# Patient Record
Sex: Male | Born: 1960 | Race: White | Hispanic: No | Marital: Married | Smoking: Former smoker
Health system: Southern US, Community
[De-identification: ages and names within clinical notes are randomized; demographics above are authoritative.]

## PROBLEM LIST (undated history)

## (undated) DIAGNOSIS — I10 Essential (primary) hypertension: Secondary | ICD-10-CM

## (undated) DIAGNOSIS — E785 Hyperlipidemia, unspecified: Secondary | ICD-10-CM

## (undated) DIAGNOSIS — F419 Anxiety disorder, unspecified: Secondary | ICD-10-CM

## (undated) DIAGNOSIS — K219 Gastro-esophageal reflux disease without esophagitis: Secondary | ICD-10-CM

---

## 2006-05-18 ENCOUNTER — Ambulatory Visit: Payer: Self-pay | Admitting: Cardiology

## 2012-03-04 ENCOUNTER — Other Ambulatory Visit: Payer: Self-pay

## 2012-03-04 ENCOUNTER — Emergency Department (HOSPITAL_COMMUNITY)
Admission: EM | Admit: 2012-03-04 | Discharge: 2012-03-04 | Disposition: A | Discharge status: Home / Self Care | Payer: Self-pay | Attending: Emergency Medicine | Admitting: Emergency Medicine

## 2012-03-04 ENCOUNTER — Encounter (HOSPITAL_COMMUNITY): Payer: Self-pay | Admitting: *Deleted

## 2012-03-04 DIAGNOSIS — I498 Other specified cardiac arrhythmias: Secondary | ICD-10-CM | POA: Insufficient documentation

## 2012-03-04 DIAGNOSIS — F411 Generalized anxiety disorder: Secondary | ICD-10-CM | POA: Insufficient documentation

## 2012-03-04 DIAGNOSIS — I10 Essential (primary) hypertension: Secondary | ICD-10-CM | POA: Insufficient documentation

## 2012-03-04 DIAGNOSIS — F419 Anxiety disorder, unspecified: Secondary | ICD-10-CM

## 2012-03-04 HISTORY — DX: Essential (primary) hypertension: I10

## 2012-03-04 LAB — BASIC METABOLIC PANEL
BUN: 18 mg/dL (ref 6–23)
CO2: 25 mEq/L (ref 19–32)
Calcium: 10 mg/dL (ref 8.4–10.5)
Chloride: 98 mEq/L (ref 96–112)
Creatinine, Ser: 1.11 mg/dL (ref 0.50–1.35)
GFR calc Af Amer: 87 mL/min — ABNORMAL LOW (ref >90)
GFR calc non Af Amer: 75 mL/min — ABNORMAL LOW (ref >90)
Glucose, Bld: 137 mg/dL — ABNORMAL HIGH (ref 70–99)
Potassium: 3.6 mEq/L (ref 3.5–5.1)
Sodium: 136 mEq/L (ref 135–145)

## 2012-03-04 LAB — CBC WITH DIFFERENTIAL/PLATELET
Basophils Absolute: 0 10*3/uL (ref 0.0–0.1)
Basophils Relative: 0 % (ref 0–1)
Eosinophils Absolute: 0 10*3/uL (ref 0.0–0.7)
Eosinophils Relative: 0 % (ref 0–5)
HCT: 46.4 % (ref 39.0–52.0)
Hemoglobin: 16.1 g/dL (ref 13.0–17.0)
Lymphocytes Relative: 8 % — ABNORMAL LOW (ref 12–46)
Lymphs Abs: 0.7 10*3/uL (ref 0.7–4.0)
MCH: 29.9 pg (ref 26.0–34.0)
MCHC: 34.7 g/dL (ref 30.0–36.0)
MCV: 86.1 fL (ref 78.0–100.0)
Monocytes Absolute: 0.6 10*3/uL (ref 0.1–1.0)
Monocytes Relative: 6 % (ref 3–12)
Neutro Abs: 8.1 10*3/uL — ABNORMAL HIGH (ref 1.7–7.7)
Neutrophils Relative %: 86 % — ABNORMAL HIGH (ref 43–77)
Platelets: 248 10*3/uL (ref 150–400)
RBC: 5.39 MIL/uL (ref 4.22–5.81)
RDW: 12.7 % (ref 11.5–15.5)
WBC: 9.3 10*3/uL (ref 4.0–10.5)

## 2012-03-04 LAB — RAPID URINE DRUG SCREEN, HOSP PERFORMED
Amphetamines: NOT DETECTED
Barbiturates: NOT DETECTED
Benzodiazepines: NOT DETECTED
Cocaine: NOT DETECTED
Opiates: NOT DETECTED
Tetrahydrocannabinol: NOT DETECTED

## 2012-03-04 LAB — ETHANOL: Alcohol, Ethyl (B): <11 mg/dL (ref 0–11)

## 2012-03-04 MED ORDER — LORAZEPAM 1 MG PO TABS
1.0000 mg | ORAL_TABLET | Freq: Once | ORAL | Status: AC
  Administered 2012-03-04: 1 mg via ORAL
  Filled 2012-03-04: qty 1

## 2012-03-04 MED ORDER — LORAZEPAM 1 MG PO TABS: ORAL_TABLET | ORAL | Status: AC

## 2012-03-04 NOTE — ED Provider Notes (Signed)
History     CSN: [id number]  Arrival date & time 03/04/12  1058   First MD Initiated Contact with Patient 03/04/12 1128      Chief Complaint  Patient presents with  . V70.1    HPI Pt was seen at 1150.  Per pt, c/o gradual onset and persistence of constant generalized anxiety over the past month. Describes his anxiety as feeling "nervous," feeling "jittery," having "tremors" and not sleeping well.  Pt states he has been taking his wife's xanax, and tapered himself off of it 3 days ago.  Pt states he was [facility], rx buspar.  States the buspar "is not helping."  States he has a follow up appt at [facility] 03/23/2012.  Denies CP/palpitations, no SOB, no abd pain, no N/V/D, no fevers, no seizures, no rash, no SI, no hallucinations.    Past Medical History  Diagnosis Date  . Hypertension     History reviewed. No pertinent past surgical history.   History  Substance Use Topics  . Smoking status: Never Smoker   . Smokeless tobacco: Not on file  . Alcohol Use: No    Review of Systems ROS: Statement: All systems negative except as marked or noted in the HPI; Constitutional: Negative for fever and chills. ; ; Eyes: Negative for eye pain, redness and discharge. ; ; ENMT: Negative for ear pain, hoarseness, nasal congestion, sinus pressure and sore throat. ; ; Cardiovascular: Negative for chest pain, palpitations, diaphoresis, dyspnea and peripheral edema. ; ; Respiratory: Negative for cough, wheezing and stridor. ; ; Gastrointestinal: Negative for nausea, vomiting, diarrhea, abdominal pain, blood in stool, hematemesis, jaundice and rectal bleeding. . ; ; Genitourinary: Negative for dysuria, flank pain and hematuria. ; ; Musculoskeletal: Negative for back pain and neck pain. Negative for swelling and trauma.; ; Skin: Negative for pruritus, rash, abrasions, blisters, bruising and skin lesion.; ; Neuro: Negative for headache, lightheadedness and neck stiffness. Negative for  weakness, altered level of consciousness , altered mental status, extremity weakness, paresthesias, involuntary movement, seizure and syncope.; Psych:  +anxiety. No SI, no SA, no HI, no hallucinations.      Allergies  Review of patient's allergies indicates no known allergies.  Home Medications   Current Outpatient Rx  Name Route Sig Dispense Refill  . BUSPIRONE HCL 5 MG PO TABS Oral Take 5 mg by mouth 2 (two) times daily.    Marland Kitchen LISINOPRIL-HYDROCHLOROTHIAZIDE 10-12.5 MG PO TABS Oral Take 1 tablet by mouth daily.      BP 171/84  Pulse 147  Temp 98 F (36.7 C) (Oral)  Resp 20  Ht 5\' 10"  (1.778 m)  Wt 178 lb (80.74 kg)  BMI 25.54 kg/m2  SpO2 99%  Physical Exam 1155: Physical examination:  Nursing notes reviewed; Vital signs and O2 SAT reviewed;  Constitutional: Well developed, Well nourished, Well hydrated, In no acute distress; Head:  Normocephalic, atraumatic; Eyes: EOMI, PERRL, No scleral icterus; ENMT: Mouth and pharynx normal, Mucous membranes moist; Neck: Supple, Full range of motion, No lymphadenopathy; Cardiovascular: Tachycardic rate and rhythm, No gallop; Respiratory: Breath sounds clear & equal bilaterally, No rales, rhonchi, wheezes.  Speaking full sentences with ease, Normal respiratory effort/excursion; Chest: Nontender, Movement normal; Abdomen: Soft, Nontender, Nondistended, Normal bowel sounds; Extremities: Pulses normal, No tenderness, No edema, No calf edema or asymmetry.; Neuro: AA&Ox3, Major CN grossly intact.  Speech clear. No facial droop. No gross focal motor or sensory deficits in extremities.; Skin: Color normal, Warm, Dry; Psych:  Anxious.    ED Course  Procedures   MDM  MDM Reviewed: nursing note and vitals Interpretation: labs and ECG    Date: 03/04/2012  Rate: 109  Rhythm: sinus tachycardia  QRS Axis: normal  Intervals: normal  ST/T Wave abnormalities: normal  Conduction Disutrbances:none  Narrative Interpretation:   Old EKG Reviewed: none  available.  Results for orders placed during the hospital encounter of 03/04/12  CBC WITH DIFFERENTIAL      Component Value Range   WBC 9.3  4.0 - 10.5 K/uL   RBC 5.39  4.22 - 5.81 MIL/uL   Hemoglobin 16.1  13.0 - 17.0 g/dL   HCT 16.1  09.6 - 04.5 %   MCV 86.1  78.0 - 100.0 fL   MCH 29.9  26.0 - 34.0 pg   MCHC 34.7  30.0 - 36.0 g/dL   RDW 40.9  81.1 - 91.4 %   Platelets 248  150 - 400 K/uL   Neutrophils Relative 86 (*) 43 - 77 %   Neutro Abs 8.1 (*) 1.7 - 7.7 K/uL   Lymphocytes Relative 8 (*) 12 - 46 %   Lymphs Abs 0.7  0.7 - 4.0 K/uL   Monocytes Relative 6  3 - 12 %   Monocytes Absolute 0.6  0.1 - 1.0 K/uL   Eosinophils Relative 0  0 - 5 %   Eosinophils Absolute 0.0  0.0 - 0.7 K/uL   Basophils Relative 0  0 - 1 %   Basophils Absolute 0.0  0.0 - 0.1 K/uL  BASIC METABOLIC PANEL      Component Value Range   Sodium 136  135 - 145 mEq/L   Potassium 3.6  3.5 - 5.1 mEq/L   Chloride 98  96 - 112 mEq/L   CO2 25  19 - 32 mEq/L   Glucose, Bld 137 (*) 70 - 99 mg/dL   BUN 18  6 - 23 mg/dL   Creatinine, Ser 7.82  0.50 - 1.35 mg/dL   Calcium 95.6  8.4 - 21.3 mg/dL   GFR calc non Af Amer 75 (*) >90 mL/min   GFR calc Af Amer 87 (*) >90 mL/min  ETHANOL      Component Value Range   Alcohol, Ethyl (B) <11  0 - 11 mg/dL  URINE RAPID DRUG SCREEN (HOSP PERFORMED)      Component Value Range   Opiates NONE DETECTED  NONE DETECTED   Cocaine NONE DETECTED  NONE DETECTED   Benzodiazepines NONE DETECTED  NONE DETECTED   Amphetamines NONE DETECTED  NONE DETECTED   Tetrahydrocannabinol NONE DETECTED  NONE DETECTED   Barbiturates NONE DETECTED  NONE DETECTED     1500:  ACT Samson Frederic has eval:  Pt continues to deny SI, only c/o anxiety, states he has an appt at [facility] on 03/23/12 and requesting something for anxiety "to get through until then."  Will rx short course of ativan, as pt states he feels "better" after his dose here.  Pt wants to go home now.  Dx testing d/w pt.  Questions answered.  Verb  understanding, agreeable to d/c home with outpt f/u.               Laray Anger, DO 03/07/12 1037
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
## 2012-03-04 NOTE — ED Notes (Signed)
Pt placed in paper scrubs, belongings locked in EMS cabinet.  Room stripped of cords, security in to wand pt.  nad noted.

## 2012-03-04 NOTE — ED Notes (Signed)
Pt c/o anxiety and muscle tension. Pt states that he has been taking Xanax for the last couple weeks but has not had one for 3 days. Pt states that [facility] and was [facility] on Buspirone. Pt states that it is not helping. Needs help. States that he has appointment on 03/23/12 at [facility] to see the MD but needs help now.

## 2013-07-10 ENCOUNTER — Emergency Department (HOSPITAL_COMMUNITY): Payer: No Typology Code available for payment source

## 2013-07-10 ENCOUNTER — Emergency Department (HOSPITAL_COMMUNITY)
Admission: EM | Admit: 2013-07-10 | Discharge: 2013-07-10 | Disposition: A | Discharge status: Home / Self Care | Payer: No Typology Code available for payment source | Attending: Emergency Medicine | Admitting: Emergency Medicine

## 2013-07-10 ENCOUNTER — Encounter (HOSPITAL_COMMUNITY): Payer: Self-pay | Admitting: Emergency Medicine

## 2013-07-10 DIAGNOSIS — Y92410 Unspecified street and highway as the place of occurrence of the external cause: Secondary | ICD-10-CM | POA: Insufficient documentation

## 2013-07-10 DIAGNOSIS — I10 Essential (primary) hypertension: Secondary | ICD-10-CM | POA: Insufficient documentation

## 2013-07-10 DIAGNOSIS — Z87891 Personal history of nicotine dependence: Secondary | ICD-10-CM | POA: Insufficient documentation

## 2013-07-10 DIAGNOSIS — Y9389 Activity, other specified: Secondary | ICD-10-CM | POA: Insufficient documentation

## 2013-07-10 DIAGNOSIS — Z7982 Long term (current) use of aspirin: Secondary | ICD-10-CM | POA: Insufficient documentation

## 2013-07-10 DIAGNOSIS — Z79899 Other long term (current) drug therapy: Secondary | ICD-10-CM | POA: Insufficient documentation

## 2013-07-10 DIAGNOSIS — S0990XA Unspecified injury of head, initial encounter: Secondary | ICD-10-CM | POA: Insufficient documentation
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
## 2013-07-10 NOTE — ED Notes (Signed)
Pt was ambulated and did great No dizziness and stable

## 2014-05-29 ENCOUNTER — Emergency Department (HOSPITAL_COMMUNITY)
Admission: EM | Admit: 2014-05-29 | Discharge: 2014-05-29 | Disposition: A | Discharge status: Home / Self Care | Payer: No Typology Code available for payment source | Attending: Emergency Medicine | Admitting: Emergency Medicine

## 2014-05-29 ENCOUNTER — Encounter (HOSPITAL_COMMUNITY): Payer: Self-pay | Admitting: Emergency Medicine

## 2014-05-29 DIAGNOSIS — Y929 Unspecified place or not applicable: Secondary | ICD-10-CM | POA: Insufficient documentation

## 2014-05-29 DIAGNOSIS — I10 Essential (primary) hypertension: Secondary | ICD-10-CM | POA: Insufficient documentation

## 2014-05-29 DIAGNOSIS — Y939 Activity, unspecified: Secondary | ICD-10-CM | POA: Insufficient documentation

## 2014-05-29 DIAGNOSIS — Z7982 Long term (current) use of aspirin: Secondary | ICD-10-CM | POA: Insufficient documentation

## 2014-05-29 DIAGNOSIS — T63441A Toxic effect of venom of bees, accidental (unintentional), initial encounter: Secondary | ICD-10-CM | POA: Insufficient documentation

## 2014-05-29 DIAGNOSIS — Z87891 Personal history of nicotine dependence: Secondary | ICD-10-CM | POA: Insufficient documentation

## 2014-05-29 DIAGNOSIS — Z79899 Other long term (current) drug therapy: Secondary | ICD-10-CM | POA: Insufficient documentation

## 2014-05-29 MED ORDER — PREDNISONE 10 MG PO TABS
60.0000 mg | ORAL_TABLET | Freq: Once | ORAL | Status: AC
  Administered 2014-05-29: 60 mg via ORAL
  Filled 2014-05-29 (×2): qty 1

## 2014-05-29 MED ORDER — DIPHENHYDRAMINE HCL 25 MG PO TABS: 25.0000 mg | ORAL_TABLET | ORAL | Status: DC | PRN

## 2014-05-29 MED ORDER — EPINEPHRINE 0.3 MG/0.3ML IJ SOAJ: 0.3000 mg | Freq: Once | INTRAMUSCULAR | Status: DC

## 2014-05-29 MED ORDER — PREDNISONE 50 MG PO TABS: ORAL_TABLET | ORAL | Status: DC

## 2014-05-29 NOTE — Progress Notes (Signed)
CM called to ED for medication assistance. Pt does not have insurance and [facility]. Pt needing epi pen urgently. [employer] voucher given to pt. Explained MATCH voucher to pt who verbalized understanding. No other CM needs noted.
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
## 2014-05-29 NOTE — ED Provider Notes (Signed)
CSN: [id number]     Arrival date & time 05/29/14  1307 History   First MD Initiated Contact with Patient 05/29/14 1409     Chief complaint - bee sting    Patient is a 53 y.o. male presenting with allergic reaction. The history is provided by the patient.  Allergic Reaction Presenting symptoms: swelling   Presenting symptoms: no difficulty breathing, no difficulty swallowing, no itching, no rash and no wheezing   Severity:  Mild Relieved by:  Cold compresses Worsened by:  Nothing tried  Patient reports he was stung on right side of face by bee over 2 hours ago.  (nurse documented left side of face, but he was stung on right side) He reports localized swelling without any other symptoms - no difficulty breathing/swallowing.  No rash/itching is reported He has been stung previously but has never had swelling like this.  Past Medical History  Diagnosis Date  . Hypertension    History reviewed. No pertinent past surgical history. History reviewed. No pertinent family history. History  Substance Use Topics  . Smoking status: Former Games developermoker  . Smokeless tobacco: Not on file  . Alcohol Use: No    Review of Systems  Constitutional: Negative for fever.  HENT: Negative for trouble swallowing.   Respiratory: Negative for shortness of breath and wheezing.   Cardiovascular: Negative for chest pain.  Gastrointestinal: Negative for diarrhea.  Skin: Negative for itching and rash.  Neurological: Negative for syncope.  All other systems reviewed and are negative.     Allergies  Review of patient's allergies indicates no known allergies.  Home Medications   Prior to Admission medications   Medication Sig Start Date End Date Taking? Authorizing Provider  aspirin EC 81 MG tablet Take 81 mg by mouth every morning.   Yes Historical Provider, MD  carvedilol (COREG) 6.25 MG tablet Take 6.25 mg by mouth 2 (two) times daily.   Yes Historical Provider, MD  lisinopril-hydrochlorothiazide  (PRINZIDE,ZESTORETIC) 10-12.5 MG per tablet Take 1 tablet by mouth daily.   Yes Historical Provider, MD  Omega-3 Fatty Acids (OMEGA-3 PO) Take 1 capsule by mouth 2 (two) times daily.   Yes Historical Provider, MD  diphenhydrAMINE (BENADRYL) 25 MG tablet Take 1 tablet (25 mg total) by mouth every 4 (four) hours as needed for itching. 05/29/14   Joya Gaskinsonald W Felice Hope, MD  EPINEPHrine (EPIPEN 2-PAK) 0.3 mg/0.3 mL IJ SOAJ injection Inject 0.3 mLs (0.3 mg total) into the muscle once. 05/29/14   Joya Gaskinsonald W Hoyt Leanos, MD  predniSONE (DELTASONE) 50 MG tablet One tablet PO daily for 4 days 05/29/14   Joya Gaskinsonald W Ranferi Clingan, MD   BP 166/101  Pulse 82  Temp(Src) 98.7 F (37.1 C) (Oral)  Resp 18  Ht 5\' 10"  (1.778 m)  Wt 170 lb (77.111 kg)  BMI 24.39 kg/m2  SpO2 100% Physical Exam CONSTITUTIONAL: Well developed/well nourished HEAD: Normocephalic/atraumatic EYES: EOMI/PERRL ENMT: Mucous membranes moist. Minimal edema to right side of face.  His lips and tongue are not edematous.  Uvula midline.  No stridor.   NECK: supple no meningeal signs CV: S1/S2 noted, no murmurs/rubs/gallops noted LUNGS: Lungs are clear to auscultation bilaterally, no apparent distress ABDOMEN: soft, nontender, no rebound or guarding NEURO: Pt is awake/alert, moves all extremitiesx4 EXTREMITIES: pulses normal, full ROM SKIN: warm, color normal, no rash is noted PSYCH: no abnormalities of mood noted  ED Course  Procedures   Pt with localized swelling to right side of face.  I don't feel he has true  anaphylactic reaction and merely has localized edema from sting.  He has no other symptoms He was give Rx for epipen in case he has any new symptoms at home (difficulty breathing/swallowing, any tongue or lip swelling)  MDM   Final diagnoses:  Allergic reaction to bee sting, accidental or unintentional, initial encounter    Nursing notes including past medical history and social history reviewed and considered in  documentation     Joya Gaskinsonald W Burnett Spray, MD 05/29/14 1549

## 2014-05-29 NOTE — ED Notes (Signed)
Pt and family enquiring as too cost of epi pen - pt doesn't have any insurance - gets assistance from the health dept concerning his prescription meds .-- call placed to case management , discussed situation with Barbette OrGenevieve, Stated that   she would check into it and come to see pt or let his nurse know if any assistance can be given

## 2014-05-29 NOTE — Discharge Instructions (Signed)

## 2014-05-29 NOTE — ED Notes (Signed)
Pt and family member educated on use of epi pen , instructed pt to read instructions and to ensure that he keeps the epi pen needle in his leg for the required 10 seconds minimum .

## 2016-09-28 ENCOUNTER — Emergency Department (HOSPITAL_COMMUNITY)
Admission: EM | Admit: 2016-09-28 | Discharge: 2016-09-28 | Disposition: A | Discharge status: Home / Self Care | Payer: Self-pay | Attending: Emergency Medicine | Admitting: Emergency Medicine

## 2016-09-28 ENCOUNTER — Emergency Department (HOSPITAL_COMMUNITY): Payer: Self-pay

## 2016-09-28 ENCOUNTER — Encounter (HOSPITAL_COMMUNITY): Payer: Self-pay | Admitting: Emergency Medicine

## 2016-09-28 DIAGNOSIS — Z7982 Long term (current) use of aspirin: Secondary | ICD-10-CM | POA: Insufficient documentation

## 2016-09-28 DIAGNOSIS — J069 Acute upper respiratory infection, unspecified: Secondary | ICD-10-CM | POA: Insufficient documentation

## 2016-09-28 DIAGNOSIS — Z79899 Other long term (current) drug therapy: Secondary | ICD-10-CM | POA: Insufficient documentation

## 2016-09-28 DIAGNOSIS — I10 Essential (primary) hypertension: Secondary | ICD-10-CM | POA: Insufficient documentation

## 2016-09-28 MED ORDER — BENZONATATE 100 MG PO CAPS: 200.0000 mg | ORAL_CAPSULE | Freq: Two times a day (BID) | ORAL | 0 refills | Status: DC | PRN

## 2016-09-28 MED ORDER — FLUTICASONE PROPIONATE 50 MCG/ACT NA SUSP: 1.0000 | Freq: Every day | NASAL | 2 refills | Status: AC

## 2016-09-28 MED ORDER — CETIRIZINE HCL 10 MG PO TABS: 10.0000 mg | ORAL_TABLET | Freq: Every day | ORAL | 0 refills | Status: DC

## 2016-09-28 NOTE — ED Triage Notes (Signed)
Patient c/o cough with sore throat that started yesterday. Denies any fevers. Per patient sinus pressure and congestion. Patient states cough occasional productive. Per patient friend recently diagnosed with Strep.

## 2016-09-28 NOTE — ED Provider Notes (Signed)
[facility]EM[facility]PT Provider Note   CSN: [id number] Arrival date & time: 09/28/16  1359  By signing my name below, I, Cynda Acres, attest that this documentation has been prepared under the direction and in the presence of Roxy Horseman, PA-C. Electronically Signed: Cynda Acres, Scribe. 09/28/16. 3:10 PM.  History   Chief Complaint Chief Complaint  Patient presents with  . Cough    HPI Comments: Timothy Reed is a 56 y.o. male with a hx of HTN, who presents to the Emergency Department complaining of a sudden-onset, constant sore throat that began yesterday. Patient has associated sinus pressure, congestion, and an intermittent productive cough. Patient states his friend was recently diagnosed with strep. Patient reports using sore throat spray and gargling peroxide with no relief. Patient denies any fever, chills, body aches, nausea, vomiting, diarrhea, or tobacco use.   The history is provided by the patient. No language interpreter was used.    Past Medical History:  Diagnosis Date  . Hypertension     There are no active problems to display for this patient.   History reviewed. No pertinent surgical history.     Home Medications    Prior to Admission medications   Medication Sig Start Date End Date Taking? Authorizing Provider  aspirin EC 81 MG tablet Take 81 mg by mouth every morning.    Historical Provider, MD  carvedilol (COREG) 6.25 MG tablet Take 6.25 mg by mouth 2 (two) times daily.    Historical Provider, MD  diphenhydrAMINE (BENADRYL) 25 MG tablet Take 1 tablet (25 mg total) by mouth every 4 (four) hours as needed for itching. 05/29/14   Zadie Rhine, MD  EPINEPHrine (EPIPEN 2-PAK) 0.3 mg/0.3 mL IJ SOAJ injection Inject 0.3 mLs (0.3 mg total) into the muscle once. 05/29/14   Zadie Rhine, MD  lisinopril-hydrochlorothiazide (PRINZIDE,ZESTORETIC) 10-12.5 MG per tablet Take 1 tablet by mouth daily.    Historical Provider, MD  Omega-3 Fatty Acids  (OMEGA-3 PO) Take 1 capsule by mouth 2 (two) times daily.    Historical Provider, MD  predniSONE (DELTASONE) 50 MG tablet One tablet PO daily for 4 days 05/29/14   Zadie Rhine, MD    Family History History reviewed. No pertinent family history.  Social History Social History  Substance Use Topics  . Smoking status: Never Smoker  . Smokeless tobacco: Never Used  . Alcohol use No     Allergies   Patient has no known allergies.   Review of Systems Review of Systems  Constitutional: Negative for fever.  HENT: Positive for congestion and sinus pressure.   Gastrointestinal: Negative for diarrhea, nausea and vomiting.     Physical Exam Updated Vital Signs BP 144/93 (BP Location: Left Arm)   Pulse 96   Temp 98.9 F (37.2 C) (Oral)   Resp 16   Ht 5\' 10"  (1.778 m)   Wt 175 lb (79.4 kg)   SpO2 100%   BMI 25.11 kg/m   Physical Exam  Physical Exam  Constitutional: Pt  is oriented to person, place, and time. Appears well-developed and well-nourished. No distress.  HENT:  Head: Normocephalic and atraumatic.  Right Ear: Tympanic membrane, external ear and ear canal normal.  Left Ear: Tympanic membrane, external ear and ear canal normal.  Nose: Mucosal edema and moderate rhinorrhea present. No epistaxis. Right sinus exhibits no maxillary sinus tenderness and no frontal sinus tenderness. Left sinus exhibits no maxillary sinus tenderness and no frontal sinus tenderness.  Mouth/Throat: Uvula is midline and mucous membranes are normal.  Mucous membranes are not pale and not cyanotic. No oropharyngeal exudate, posterior oropharyngeal edema, posterior oropharyngeal erythema or tonsillar abscesses.  Eyes: Conjunctivae are normal. Pupils are equal, round, and reactive to light.  Neck: Normal range of motion and full passive range of motion without pain.  Cardiovascular: Normal rate and intact distal pulses.   Pulmonary/Chest: Effort normal and breath sounds normal. No stridor.  Clear  and equal breath sounds without focal wheezes, rhonchi, rales  Abdominal: Soft. Bowel sounds are normal. There is no tenderness.  Musculoskeletal: Normal range of motion.  Lymphadenopathy:    Pthas no cervical adenopathy.  Neurological: Pt is alert and oriented to person, place, and time.  Skin: Skin is warm and dry. No rash noted. Pt is not diaphoretic.  Psychiatric: Normal mood and affect.  Nursing note and vitals reviewed.  ED Treatments / Results  DIAGNOSTIC STUDIES: Oxygen Saturation is 100% on RA, normal by my interpretation.    COORDINATION OF CARE: 3:10 PM Discussed treatment plan with pt at bedside and pt agreed to plan, which includes cough medication, tylenol, ibuprofen, and nasal spray.   Labs (all labs ordered are listed, but only abnormal results are displayed) Labs Reviewed - No data to display  EKG  EKG Interpretation None       Radiology Dg Chest 2 View  Result Date: 09/28/2016 CLINICAL DATA:  Head congestion, cough, and sore throat for 2-3 days. Nonsmoker. History of hypertension. EXAM: CHEST  2 VIEW COMPARISON:  None in FINDINGS: The lungs are adequately inflated and clear. The heart and pulmonary vascularity are normal. The mediastinum is normal in width. The trachea is midline. The bony thorax exhibits no acute abnormality. IMPRESSION: There is no active cardiopulmonary disease. Electronically Signed   By: David  SwazilandJordan M.D.   On: 09/28/2016 14:43    Procedures Procedures (including critical care time)  Medications Ordered in ED Medications - No data to display   Initial Impression / Assessment and Plan / ED Course  I have reviewed the triage vital signs and the nursing notes.  Pertinent labs & imaging results that were available during my care of the patient were reviewed by me and considered in my medical decision making (see chart for details).     Pt CXR negative for acute infiltrate. Patients symptoms are consistent with URI, likely viral  etiology. Discussed that antibiotics are not indicated for viral infections. Pt will be discharged with symptomatic treatment.  Verbalizes understanding and is agreeable with plan. Pt is hemodynamically stable & in NAD prior to dc.   Final Clinical Impressions(s) / ED Diagnoses   Final diagnoses:  Upper respiratory tract infection, unspecified type    New Prescriptions New Prescriptions   BENZONATATE (TESSALON) 100 MG CAPSULE    Take 2 capsules (200 mg total) by mouth 2 (two) times daily as needed for cough.   CETIRIZINE (ZYRTEC ALLERGY) 10 MG TABLET    Take 1 tablet (10 mg total) by mouth daily.   FLUTICASONE (FLONASE) 50 MCG/ACT NASAL SPRAY    Place 1 spray into both nostrils daily.   I personally performed the services described in this documentation, which was scribed in my presence. The recorded information has been reviewed and is accurate.       Roxy HorsemanRobert Arminta Gamm, PA-C 09/28/16 1515    Raeford RazorStephen Kohut, MD 09/28/16 [phone]

## 2016-09-28 NOTE — ED Triage Notes (Signed)
Pt here for cough and sore throat. °

## 2016-12-19 ENCOUNTER — Encounter (HOSPITAL_COMMUNITY): Payer: Self-pay

## 2016-12-19 ENCOUNTER — Emergency Department (HOSPITAL_COMMUNITY)
Admission: EM | Admit: 2016-12-19 | Discharge: 2016-12-19 | Disposition: A | Discharge status: Home / Self Care | Payer: Self-pay | Attending: Emergency Medicine | Admitting: Emergency Medicine

## 2016-12-19 DIAGNOSIS — R221 Localized swelling, mass and lump, neck: Secondary | ICD-10-CM

## 2016-12-19 DIAGNOSIS — K029 Dental caries, unspecified: Secondary | ICD-10-CM | POA: Insufficient documentation

## 2016-12-19 DIAGNOSIS — I10 Essential (primary) hypertension: Secondary | ICD-10-CM | POA: Insufficient documentation

## 2016-12-19 DIAGNOSIS — Z79899 Other long term (current) drug therapy: Secondary | ICD-10-CM | POA: Insufficient documentation

## 2016-12-19 DIAGNOSIS — K0889 Other specified disorders of teeth and supporting structures: Secondary | ICD-10-CM

## 2016-12-19 DIAGNOSIS — R220 Localized swelling, mass and lump, head: Secondary | ICD-10-CM

## 2016-12-19 DIAGNOSIS — Z7982 Long term (current) use of aspirin: Secondary | ICD-10-CM | POA: Insufficient documentation

## 2016-12-19 MED ORDER — AMOXICILLIN 500 MG PO CAPS: 500.0000 mg | ORAL_CAPSULE | Freq: Three times a day (TID) | ORAL | 0 refills | Status: DC

## 2016-12-19 NOTE — ED Triage Notes (Signed)
Pt c/o toothache for past several days.  Reports left jaw started swelling yesterday, worse today.

## 2016-12-19 NOTE — Discharge Instructions (Signed)
Take antibiotics as directed.  Drink lots of fluid. He can use hard candy that are sour to help with reducing the swelling.  You can use warm compresses and massage the area to help reduce the swelling.  Follow-up with referred dentist. Call first. On Monday to arrange for an appointment.  Return the emergency Department for any worsening pain, fever, difficulty breathing, difficulty eating food, difficulty opening her mouth or worsening pain or any other worsening or concerning symptoms.

## 2016-12-19 NOTE — ED Provider Notes (Signed)
[facility] Provider Note   CSN: [id number] Arrival date & time: 12/19/16  1109     History   Chief Complaint Chief Complaint  Patient presents with  . Dental Pain    HPI Timothy Reed is a 56 y.o. male who presents with left-sided jaw swelling that began yesterday. Patient reports that a few days prior to onset of general swelling he had a left lower toothache. He states that tooth had begun to improve when he started Experiencing jaw swelling. He states that he has experienced this before and that usually improves on its own but since his been going on for 4 days he came for evaluation. Denies any pain to the area. He has been able to tolerate by mouth and his secretions. He has not taken any medications. He denies any pain with eating. He denies any fever, nausea/vomiting, dysuria, hematuria.  The history is provided by the patient.    Past Medical History:  Diagnosis Date  . Hypertension     There are no active problems to display for this patient.   History reviewed. No pertinent surgical history.     Home Medications    Prior to Admission medications   Medication Sig Start Date End Date Taking? Authorizing Provider  amoxicillin (AMOXIL) 500 MG capsule Take 1 capsule (500 mg total) by mouth 3 (three) times daily. 12/19/16   Maxwell Caul, PA-C  aspirin EC 81 MG tablet Take 81 mg by mouth every morning.    [provider]  benzonatate (TESSALON) 100 MG capsule Take 2 capsules (200 mg total) by mouth 2 (two) times daily as needed for cough. 09/28/16   Roxy Horseman, PA-C  carvedilol (COREG) 6.25 MG tablet Take 6.25 mg by mouth 2 (two) times daily.    [provider]  cetirizine (ZYRTEC ALLERGY) 10 MG tablet Take 1 tablet (10 mg total) by mouth daily. 09/28/16   Roxy Horseman, PA-C  diphenhydrAMINE (BENADRYL) 25 MG tablet Take 1 tablet (25 mg total) by mouth every 4 (four) hours as needed for itching. 05/29/14   Zadie Rhine, MD    EPINEPHrine (EPIPEN 2-PAK) 0.3 mg/0.3 mL IJ SOAJ injection Inject 0.3 mLs (0.3 mg total) into the muscle once. 05/29/14   Zadie Rhine, MD  fluticasone (FLONASE) 50 MCG/ACT nasal spray Place 1 spray into both nostrils daily. 09/28/16   Roxy Horseman, PA-C  lisinopril-hydrochlorothiazide (PRINZIDE,ZESTORETIC) 10-12.5 MG per tablet Take 1 tablet by mouth daily.    [provider]  Omega-3 Fatty Acids (OMEGA-3 PO) Take 1 capsule by mouth 2 (two) times daily.    [provider]  predniSONE (DELTASONE) 50 MG tablet One tablet PO daily for 4 days 05/29/14   Zadie Rhine, MD    Family History No family history on file.  Social History Social History  Substance Use Topics  . Smoking status: Never Smoker  . Smokeless tobacco: Never Used  . Alcohol use No     Allergies   Patient has no known allergies.   Review of Systems Review of Systems  Constitutional: Negative for fever.  HENT: Positive for facial swelling. Negative for sore throat and trouble swallowing.   Gastrointestinal: Negative for nausea.  Genitourinary: Negative for dysuria and hematuria.     Physical Exam Updated Vital Signs BP 135/81 (BP Location: Left Arm)   Pulse 94   Temp 98.9 F (37.2 C) (Oral)   Resp 18   Ht 5\' 10"  (1.778 m)   Wt 78 kg   SpO2  99%   BMI 24.68 kg/m   Physical Exam  Constitutional: He appears well-developed and well-nourished.  Sitting completely on examination table.  HENT:  Head: Normocephalic and atraumatic.  Mouth/Throat: Oropharynx is clear and moist and mucous membranes are normal. No oropharyngeal exudate, posterior oropharyngeal edema or posterior oropharyngeal erythema.  Multiple dental caries and evidence of dental decay. No gingival swelling. No trismus. Elevation/depression of mandible intact. No evidence of dental abscess. Mild swelling to the left jaw overlying the submandibular gland. No tenderness to palpation. No surrounding warmth, erythema.   Eyes: Conjunctivae and EOM are normal. Right eye exhibits no discharge. Left eye exhibits no discharge. No scleral icterus.  Pulmonary/Chest: Effort normal. No accessory muscle usage. No respiratory distress.  No evidence of respiratory distress. Able speak in full sentences without difficulty.  Musculoskeletal: He exhibits no deformity.  Neurological: He is alert.  Skin: Skin is warm and dry.  Psychiatric: He has a normal mood and affect. His speech is normal and behavior is normal.     ED Treatments / Results  Labs (all labs ordered are listed, but only abnormal results are displayed) Labs Reviewed - No data to display  EKG  EKG Interpretation None       Radiology No results found.  Procedures Procedures (including critical care time)  Medications Ordered in ED Medications - No data to display   Initial Impression / Assessment and Plan / ED Course  I have reviewed the triage vital signs and the nursing notes.  Pertinent labs & imaging results that were available during my care of the patient were reviewed by me and considered in my medical decision making (see chart for details).     56 year old male who presents with 4 days of worsening left jaw swelling. Patient states that initially he was experiencing some dental pain but that improved. Physical exam with multiple dental caries and evidence of dental decay. No signs of dental abscess. No trismus. Diffuse soft tissue swelling on the left jaw overlying the submandibular gland. Concern for sialadenitis. History/physical exam are not concerning for a deep neck space infection. Will plan to provide antibiotic for coverage of potential dental abscess. Instructed patient to use warm compresses and use sialagogues for relief. Will provide him with outpatient dental referall for her to follow-up with. Strict return precautions discussed. Patient expresses understanding and agreement to plan.  Final Clinical Impressions(s) / ED  Diagnoses   Final diagnoses:  Pain, dental  Submandibular swelling    New Prescriptions Discharge Medication List as of 12/19/2016  1:42 PM    START taking these medications   Details  amoxicillin (AMOXIL) 500 MG capsule Take 1 capsule (500 mg total) by mouth 3 (three) times daily., Starting Sat 12/19/2016, Print         Maxwell CaulLayden, Anzel Kearse A, PA-C 12/19/16 1645    Mesner, Barbara CowerJason, MD 12/20/16 [phone]

## 2017-05-04 ENCOUNTER — Emergency Department (HOSPITAL_COMMUNITY)
Admission: EM | Admit: 2017-05-04 | Discharge: 2017-05-04 | Disposition: A | Discharge status: Home / Self Care | Payer: Self-pay | Attending: Emergency Medicine | Admitting: Emergency Medicine

## 2017-05-04 ENCOUNTER — Encounter (HOSPITAL_COMMUNITY): Payer: Self-pay | Admitting: Emergency Medicine

## 2017-05-04 ENCOUNTER — Emergency Department (HOSPITAL_COMMUNITY): Payer: Self-pay

## 2017-05-04 DIAGNOSIS — F439 Reaction to severe stress, unspecified: Secondary | ICD-10-CM | POA: Insufficient documentation

## 2017-05-04 DIAGNOSIS — Z79899 Other long term (current) drug therapy: Secondary | ICD-10-CM | POA: Insufficient documentation

## 2017-05-04 DIAGNOSIS — K2900 Acute gastritis without bleeding: Secondary | ICD-10-CM | POA: Insufficient documentation

## 2017-05-04 DIAGNOSIS — F419 Anxiety disorder, unspecified: Secondary | ICD-10-CM | POA: Insufficient documentation

## 2017-05-04 DIAGNOSIS — I10 Essential (primary) hypertension: Secondary | ICD-10-CM | POA: Insufficient documentation

## 2017-05-04 HISTORY — DX: Anxiety disorder, unspecified: F41.9

## 2017-05-04 LAB — CBC WITH DIFFERENTIAL/PLATELET
Basophils Absolute: 0 10*3/uL (ref 0.0–0.1)
Basophils Relative: 0 %
Eosinophils Absolute: 0 10*3/uL (ref 0.0–0.7)
Eosinophils Relative: 0 %
HCT: 43.5 % (ref 39.0–52.0)
Hemoglobin: 15.1 g/dL (ref 13.0–17.0)
Lymphocytes Relative: 19 %
Lymphs Abs: 1.2 10*3/uL (ref 0.7–4.0)
MCH: 30.1 pg (ref 26.0–34.0)
MCHC: 34.7 g/dL (ref 30.0–36.0)
MCV: 86.7 fL (ref 78.0–100.0)
Monocytes Absolute: 0.4 10*3/uL (ref 0.1–1.0)
Monocytes Relative: 7 %
Neutro Abs: 4.6 10*3/uL (ref 1.7–7.7)
Neutrophils Relative %: 74 %
Platelets: 175 10*3/uL (ref 150–400)
RBC: 5.02 MIL/uL (ref 4.22–5.81)
RDW: 12.8 % (ref 11.5–15.5)
WBC: 6.2 10*3/uL (ref 4.0–10.5)

## 2017-05-04 LAB — COMPREHENSIVE METABOLIC PANEL
ALT: 25 U/L (ref 17–63)
AST: 25 U/L (ref 15–41)
Albumin: 4.3 g/dL (ref 3.5–5.0)
Alkaline Phosphatase: 68 U/L (ref 38–126)
Anion gap: 9 (ref 5–15)
BUN: 16 mg/dL (ref 6–20)
CO2: 27 mmol/L (ref 22–32)
Calcium: 9.2 mg/dL (ref 8.9–10.3)
Chloride: 99 mmol/L — ABNORMAL LOW (ref 101–111)
Creatinine, Ser: 1.06 mg/dL (ref 0.61–1.24)
GFR calc Af Amer: >60 mL/min (ref >60)
GFR calc non Af Amer: >60 mL/min (ref >60)
Glucose, Bld: 143 mg/dL — ABNORMAL HIGH (ref 65–99)
Potassium: 3.4 mmol/L — ABNORMAL LOW (ref 3.5–5.1)
Sodium: 135 mmol/L (ref 135–145)
Total Bilirubin: 0.6 mg/dL (ref 0.3–1.2)
Total Protein: 7.9 g/dL (ref 6.5–8.1)

## 2017-05-04 LAB — LIPASE, BLOOD: Lipase: 21 U/L (ref 11–51)

## 2017-05-04 LAB — TROPONIN I: Troponin I: <0.03 ng/mL (ref <0.03)

## 2017-05-04 MED ORDER — DULOXETINE HCL 30 MG PO CPEP: 30.0000 mg | ORAL_CAPSULE | Freq: Every day | ORAL | 1 refills | Status: DC

## 2017-05-04 MED ORDER — GI COCKTAIL ~~LOC~~
30.0000 mL | Freq: Once | ORAL | Status: AC
  Administered 2017-05-04: 30 mL via ORAL
  Filled 2017-05-04: qty 30

## 2017-05-04 MED ORDER — FAMOTIDINE 20 MG PO TABS: 20.0000 mg | ORAL_TABLET | Freq: Two times a day (BID) | ORAL | 0 refills | Status: DC

## 2017-05-04 NOTE — ED Triage Notes (Signed)
Epigastric pain started today.  Pt states he has issues with this when his "nerves" are tore up.  Denies n/v

## 2017-05-04 NOTE — Discharge Instructions (Signed)
Please review the discharge instructions regarding places for help with your stress and anxiety, take the medications as prescribed, return as needed for worsening symptoms

## 2017-05-04 NOTE — ED Provider Notes (Signed)
[facility]Y DEPT Provider Note   CSN: [id number] Arrival date & time: 05/04/17  8469     History   Chief Complaint Chief Complaint  Patient presents with  . Abdominal Pain    HPI Timothy Reed is a 56 y.o. male.  HPI Patient presents to the emergency room for evaluation of upper abdominal discomfort. Patient states he has a tightness in his upper abdomen. It feels like it's related to his stress and anxiety. Symptoms seem to be triggered by whenever he is having stresshis blood pressure will become elevated. He denies any trouble with any chest pain or shortness of breath. He states he exercises regularly and never has any trouble.  Patient does not smoke. No history of heart or lung disease.  He is not currently taking anything for anxiety. He [facility] a doctor [facility] and states they do not treat anxiety issues. Past Medical History:  Diagnosis Date  . Anxiety   . Hypertension     There are no active problems to display for this patient.   History reviewed. No pertinent surgical history.     Home Medications    Prior to Admission medications   Medication Sig Start Date End Date Taking? Authorizing Provider  amitriptyline (ELAVIL) 100 MG tablet Take 100 mg by mouth at bedtime.   Yes [provider]  carvedilol (COREG) 6.25 MG tablet Take 6.25 mg by mouth 2 (two) times daily.   Yes [provider]  EPINEPHrine (EPIPEN 2-PAK) 0.3 mg/0.3 mL IJ SOAJ injection Inject 0.3 mLs (0.3 mg total) into the muscle once. 05/29/14  Yes Zadie Rhine, MD  fluticasone (FLONASE) 50 MCG/ACT nasal spray Place 1 spray into both nostrils daily. Patient taking differently: Place 1 spray into both nostrils daily as needed for allergies or rhinitis.  09/28/16  Yes Roxy Horseman, PA-C  lisinopril-hydrochlorothiazide (PRINZIDE,ZESTORETIC) 20-12.5 MG tablet Take 1 tablet by mouth daily.   Yes [provider]  lovastatin (MEVACOR) 20 MG tablet Take  20 mg by mouth at bedtime.   Yes [provider]  DULoxetine (CYMBALTA) 30 MG capsule Take 1 capsule (30 mg total) by mouth daily. 05/04/17   Linwood Dibbles, MD  famotidine (PEPCID) 20 MG tablet Take 1 tablet (20 mg total) by mouth 2 (two) times daily. 05/04/17   Linwood Dibbles, MD    Family History No family history on file.  Social History Social History  Substance Use Topics  . Smoking status: Never Smoker  . Smokeless tobacco: Never Used  . Alcohol use No     Allergies   Patient has no known allergies.   Review of Systems Review of Systems  All other systems reviewed and are negative.    Physical Exam Updated Vital Signs BP (!) 144/92 (BP Location: Left Arm)   Pulse (!) 107   Temp 98.9 F (37.2 C) (Oral)   Resp 18   Ht 1.778 m ( )   Wt 74.8 kg (165 lb)   SpO2 96%   BMI 23.68 kg/m   Physical Exam  Constitutional: He appears well-developed and well-nourished. No distress.  HENT:  Head: Normocephalic and atraumatic.  Right Ear: External ear normal.  Left Ear: External ear normal.  Eyes: Conjunctivae are normal. Right eye exhibits no discharge. Left eye exhibits no discharge. No scleral icterus.  Neck: Neck supple. No tracheal deviation present.  Cardiovascular: Normal rate, regular rhythm and intact distal pulses.   Pulmonary/Chest: Effort normal and breath sounds normal. No stridor. No respiratory  distress. He has no wheezes. He has no rales.  Abdominal: Soft. Bowel sounds are normal. He exhibits no distension. There is no tenderness. There is no rebound and no guarding.  Musculoskeletal: He exhibits no edema or tenderness.  Neurological: He is alert. He has normal strength. No cranial nerve deficit (no facial droop, extraocular movements intact, no slurred speech) or sensory deficit. He exhibits normal muscle tone. He displays no seizure activity. Coordination normal.  Skin: Skin is warm and dry. No rash noted.  Psychiatric: He has a normal mood and  affect.  Nursing note and vitals reviewed.    ED Treatments / Results  Labs (all labs ordered are listed, but only abnormal results are displayed) Labs Reviewed  COMPREHENSIVE METABOLIC PANEL - Abnormal; Notable for the following:       Result Value   Potassium 3.4 (*)    Chloride 99 (*)    Glucose, Bld 143 (*)    All other components within normal limits  CBC WITH DIFFERENTIAL/PLATELET  LIPASE, BLOOD  TROPONIN I     Radiology Dg Chest 2 View  Result Date: 05/04/2017 CLINICAL DATA:  Chest pain EXAM: CHEST  2 VIEW COMPARISON:  September 28 2016 FINDINGS: The heart size and mediastinal contours are within normal limits. There is no focal infiltrate, pulmonary edema, or pleural effusion. There is a question 5 mm nodule in the left lung base. The visualized skeletal structures are unremarkable. IMPRESSION: No active cardiopulmonary disease. Question 5 mm nodule in the left lung base. Further evaluation with chest CT on outpatient basis is recommended. Electronically Signed   By: Sherian Rein M.D.   On: 05/04/2017 20:46    Procedures Procedures (including critical care time)  Medications Ordered in ED Medications  gi cocktail (Maalox,Lidocaine,Donnatal) (30 mLs Oral Given 05/04/17 2004)     Initial Impression / Assessment and Plan / ED Course  I have reviewed the triage vital signs and the nursing notes.  Pertinent labs & imaging results that were available during my care of the patient were reviewed by me and considered in my medical decision making (see chart for details).   patient presented to the emergency room with complaints of stress and a tightness in his upper abdomen. Patient states his happens to him when he has increasing anxiety and stress. He has been having issues at work. Patient denies having any trouble when he is exercising and he exercises regularly. He does not smoke. I doubt the symptoms are related to cardiac etiology. I doubt pancreatitis or cholecystitis.  Plan on discharge home with antacids. I will also give a prescription for Cymbalta to recommend following up with mental health provider  Final Clinical Impressions(s) / ED Diagnoses   Final diagnoses:  Acute gastritis without hemorrhage, unspecified gastritis type  Anxiety  Stress    New Prescriptions New Prescriptions   DULOXETINE (CYMBALTA) 30 MG CAPSULE    Take 1 capsule (30 mg total) by mouth daily.   FAMOTIDINE (PEPCID) 20 MG TABLET    Take 1 tablet (20 mg total) by mouth 2 (two) times daily.     Linwood Dibbles, MD 05/04/17 2153

## 2017-05-05 ENCOUNTER — Emergency Department (HOSPITAL_COMMUNITY)
Admission: EM | Admit: 2017-05-05 | Discharge: 2017-05-05 | Disposition: A | Discharge status: Home / Self Care | Payer: Self-pay | Attending: Emergency Medicine | Admitting: Emergency Medicine

## 2017-05-05 ENCOUNTER — Encounter (HOSPITAL_COMMUNITY): Payer: Self-pay | Admitting: *Deleted

## 2017-05-05 DIAGNOSIS — I10 Essential (primary) hypertension: Secondary | ICD-10-CM | POA: Insufficient documentation

## 2017-05-05 DIAGNOSIS — Z79899 Other long term (current) drug therapy: Secondary | ICD-10-CM | POA: Insufficient documentation

## 2017-05-05 DIAGNOSIS — F419 Anxiety disorder, unspecified: Secondary | ICD-10-CM

## 2017-05-05 MED ORDER — LORAZEPAM 1 MG PO TABS
1.0000 mg | ORAL_TABLET | Freq: Once | ORAL | Status: AC
  Administered 2017-05-05: 1 mg via ORAL
  Filled 2017-05-05: qty 1

## 2017-05-05 NOTE — ED Triage Notes (Signed)
Pt brought in by [facility] for c/o anxiety; pt states he was seen here last night for same complaint and given an anti-anxiety med that he was not able to get filled; pt denies any pain

## 2017-05-05 NOTE — Discharge Instructions (Signed)
You were seen today again for anxiety. You were given one dose of anxiety medication. Get your prior prescription refilled as soon as possible. You will be provided resources for outpatient counseling.

## 2017-05-05 NOTE — ED Provider Notes (Signed)
[facility] Provider Note   CSN: [id number] Arrival date & time: 05/05/17  0253     History   Chief Complaint Chief Complaint  Patient presents with  . Anxiety    HPI Timothy Reed is a 56 y.o. male.  HPI  This a 56 year old male who presents with worsening anxiety. Patient was seen and evaluated at 7 PM yesterday. At that time he is complaining of abdominal pain but also endorsing increasing anxiety. He was discharged home with prescription for an SSRI. He was unable to get this filled. He states that when he got home "I just didn't feel good." He used his automatic blood pressure cuff and noted his blood pressure was 170/100. He states that he get nervous and hypertensive with increasing anxiety. Cannot identify any current stressors. Denies SI or HI. Denies any chest pain, shortness breath, headache. He did take his blood pressure medication last night. Blood pressure upon arrival 130/96. Patient is requesting something for anxiety.  Past Medical History:  Diagnosis Date  . Anxiety   . Hypertension     There are no active problems to display for this patient.   History reviewed. No pertinent surgical history.     Home Medications    Prior to Admission medications   Medication Sig Start Date End Date Taking? Authorizing Provider  amitriptyline (ELAVIL) 100 MG tablet Take 100 mg by mouth at bedtime.    [provider]  carvedilol (COREG) 6.25 MG tablet Take 6.25 mg by mouth 2 (two) times daily.    [provider]  DULoxetine (CYMBALTA) 30 MG capsule Take 1 capsule (30 mg total) by mouth daily. 05/04/17   Linwood Dibbles, MD  EPINEPHrine (EPIPEN 2-PAK) 0.3 mg/0.3 mL IJ SOAJ injection Inject 0.3 mLs (0.3 mg total) into the muscle once. 05/29/14   Zadie Rhine, MD  famotidine (PEPCID) 20 MG tablet Take 1 tablet (20 mg total) by mouth 2 (two) times daily. 05/04/17   Linwood Dibbles, MD  fluticasone (FLONASE) 50 MCG/ACT nasal spray Place 1 spray into  both nostrils daily. Patient taking differently: Place 1 spray into both nostrils daily as needed for allergies or rhinitis.  09/28/16   Roxy Horseman, PA-C  lisinopril-hydrochlorothiazide (PRINZIDE,ZESTORETIC) 20-12.5 MG tablet Take 1 tablet by mouth daily.    [provider]  lovastatin (MEVACOR) 20 MG tablet Take 20 mg by mouth at bedtime.    [provider]    Family History History reviewed. No pertinent family history.  Social History Social History  Substance Use Topics  . Smoking status: Never Smoker  . Smokeless tobacco: Never Used  . Alcohol use No     Allergies   Patient has no known allergies.   Review of Systems Review of Systems  Constitutional: Negative for fever.  Respiratory: Negative for shortness of breath.   Cardiovascular: Negative for chest pain.  Gastrointestinal: Negative for abdominal pain, nausea and vomiting.  Neurological: Negative for headaches.  Psychiatric/Behavioral: Negative for suicidal ideas. The patient is nervous/anxious.   All other systems reviewed and are negative.    Physical Exam Updated Vital Signs BP (!) 130/96   Pulse 96   Temp 98.3 F (36.8 C) (Oral)   Resp 16   Ht  (1.778 m)   Wt 74.8 kg (165 lb)   SpO2 98%   BMI 23.68 kg/m   Physical Exam  Constitutional: He is oriented to person, place, and time. He appears well-developed and well-nourished. No distress.  Mildly disheveled appearing  HENT:  Head: Normocephalic and atraumatic.  Cardiovascular: Normal rate, regular rhythm and normal heart sounds.   Pulmonary/Chest: Effort normal and breath sounds normal. No respiratory distress.  Abdominal: Soft. There is no tenderness.  Musculoskeletal: He exhibits no edema.  Neurological: He is alert and oriented to person, place, and time.  Skin: Skin is warm and dry.  Psychiatric: He has a normal mood and affect.  Flat affect  Nursing note and vitals reviewed.    ED Treatments / Results   Labs (all labs ordered are listed, but only abnormal results are displayed) Labs Reviewed - No data to display  EKG  EKG Interpretation None       Radiology Dg Chest 2 View  Result Date: 05/04/2017 CLINICAL DATA:  Chest pain EXAM: CHEST  2 VIEW COMPARISON:  September 28 2016 FINDINGS: The heart size and mediastinal contours are within normal limits. There is no focal infiltrate, pulmonary edema, or pleural effusion. There is a question 5 mm nodule in the left lung base. The visualized skeletal structures are unremarkable. IMPRESSION: No active cardiopulmonary disease. Question 5 mm nodule in the left lung base. Further evaluation with chest CT on outpatient basis is recommended. Electronically Signed   By: Sherian Rein M.D.   On: 05/04/2017 20:46    Procedures Procedures (including critical care time)  Medications Ordered in ED Medications  LORazepam (ATIVAN) tablet 1 mg (1 mg Oral Given 05/05/17 0325)     Initial Impression / Assessment and Plan / ED Course  I have reviewed the triage vital signs and the nursing notes.  Pertinent labs & imaging results that were available during my care of the patient were reviewed by me and considered in my medical decision making (see chart for details).     Patient presents with concerns for worsening anxiety. Reports high blood pressure readings at home but blood pressure currently 130/96.  He is nontoxic-appearing. Denies any physical complaints at this time with the exception any anxiety. He was unable to get his prescription filled because the pharmacy was closed. Patient was provided with 1 mg of Ativan. I have reviewed his prior evaluation less than 12 hours ago. Lab work was reassuring. Do not feel he needs further workup at this time. Patient was given resources for outpatient counseling and encouraged to get his prescription filled.  After history, exam, and medical workup I feel the patient has been appropriately medically screened  and is safe for discharge home. Pertinent diagnoses were discussed with the patient. Patient was given return precautions.   Final Clinical Impressions(s) / ED Diagnoses   Final diagnoses:  Anxiety    New Prescriptions New Prescriptions   No medications on file     Shon Baton, MD 05/05/17 [phone]

## 2018-01-30 IMAGING — DX DG CHEST 2V
2 series · 2 of 2 positions shown · non-contrast
Comparison: September 28, 2016

CLINICAL DATA: Chest pain

EXAM:
CHEST  2 VIEW

[chest pa]
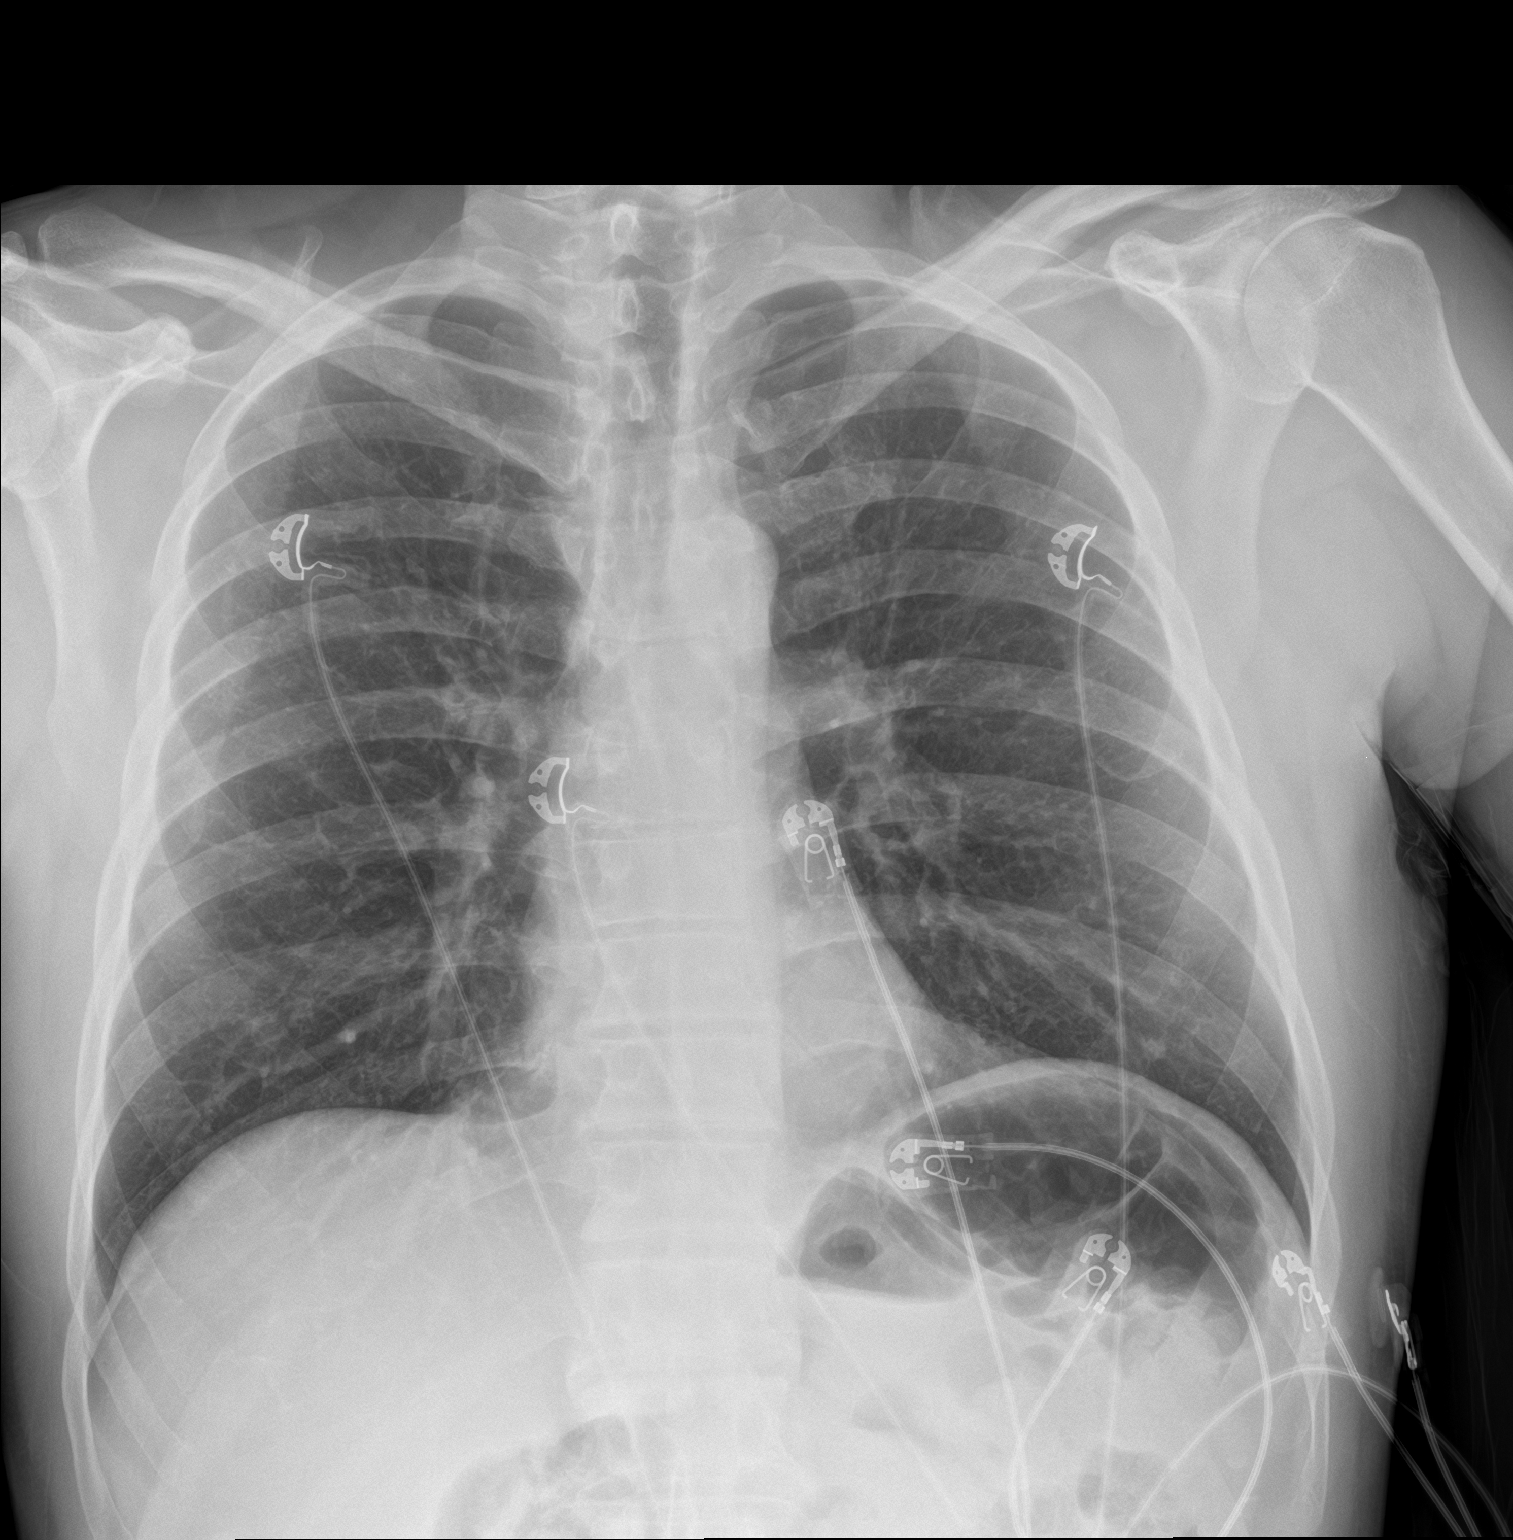

[chest lat]
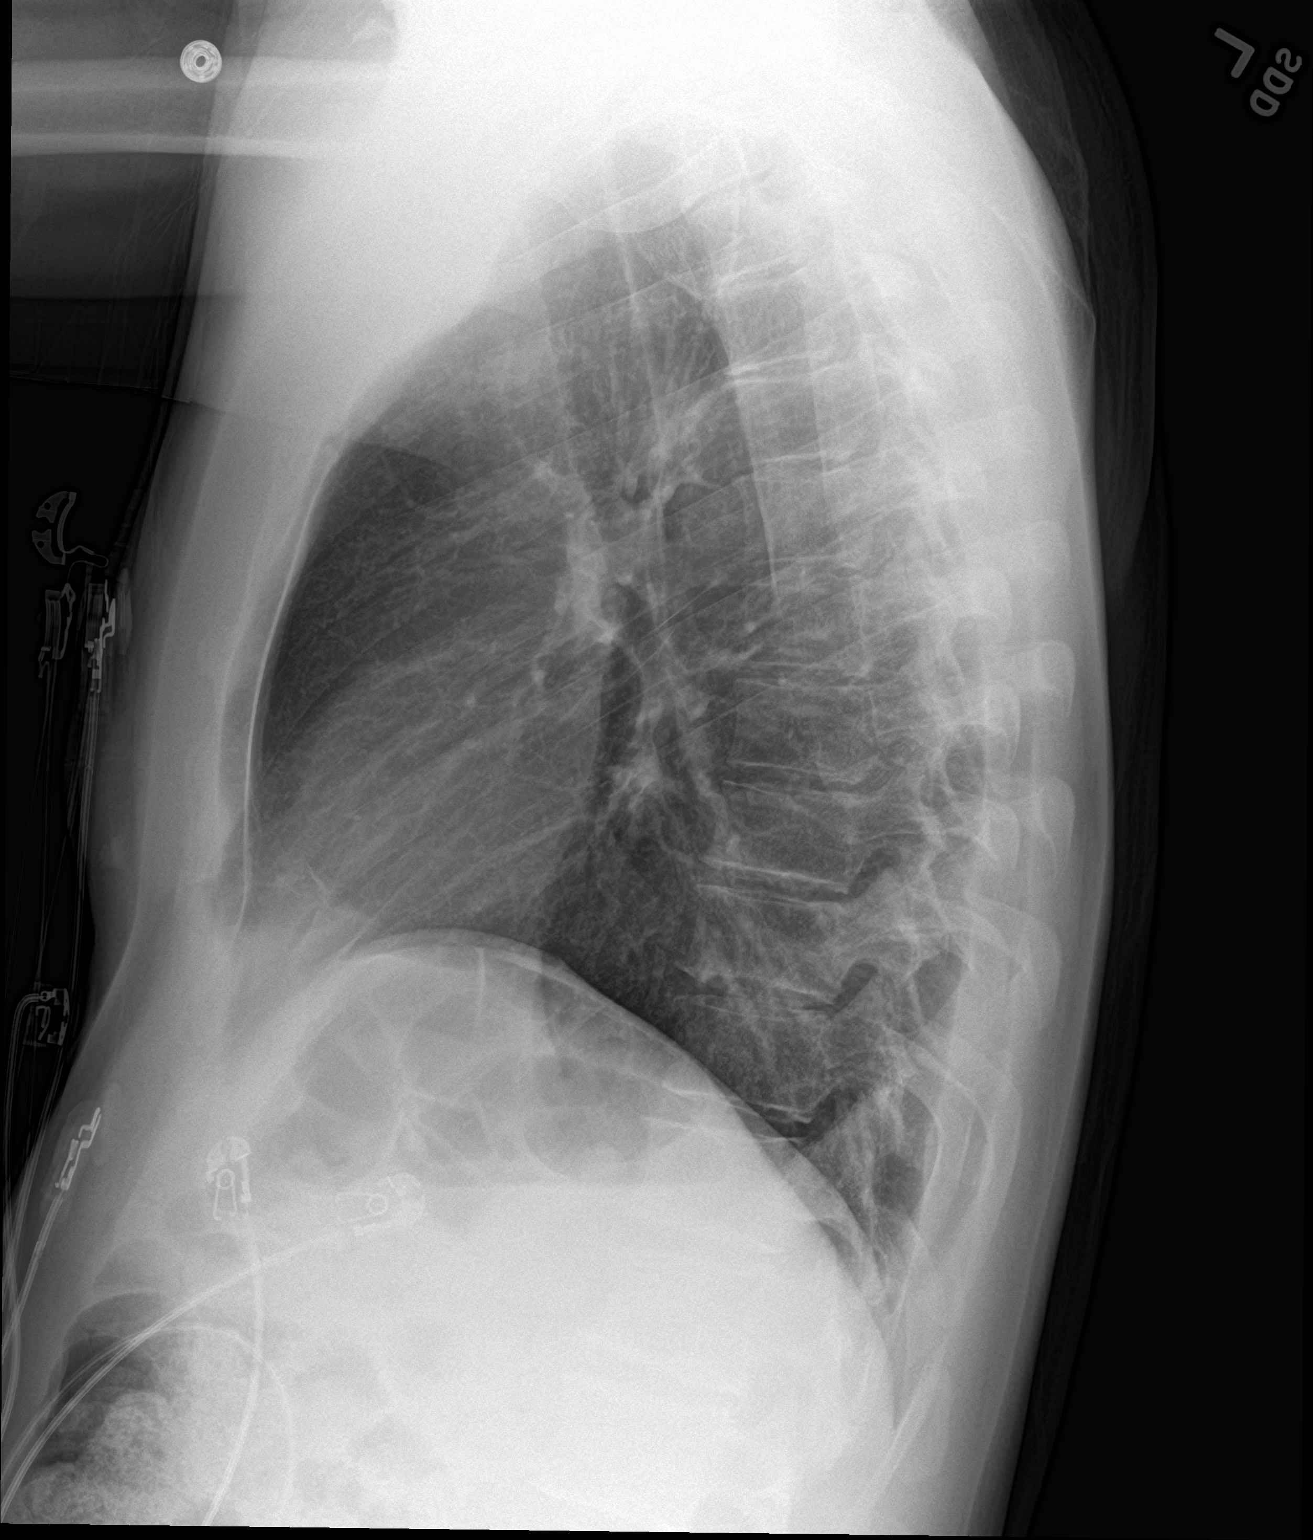

[2 of 2 positions shown; findings below may reference images not displayed]

FINDINGS: The heart size and mediastinal contours are within normal limits.
There is no focal infiltrate, pulmonary edema, or pleural effusion.
There is a question 5 mm nodule in the left lung base. The
visualized skeletal structures are unremarkable.
IMPRESSION: No active cardiopulmonary disease.

Question 5 mm nodule in the left lung base. Further evaluation with
chest CT on outpatient basis is recommended.

## 2018-02-22 ENCOUNTER — Other Ambulatory Visit (HOSPITAL_COMMUNITY): Payer: Self-pay | Admitting: *Deleted

## 2018-02-22 ENCOUNTER — Ambulatory Visit (HOSPITAL_COMMUNITY)
Admission: RE | Admit: 2018-02-22 | Discharge: 2018-02-22 | Disposition: A | Discharge status: Home / Self Care | Payer: Self-pay | Source: Ambulatory Visit | Attending: *Deleted | Admitting: *Deleted

## 2018-02-22 DIAGNOSIS — D72829 Elevated white blood cell count, unspecified: Secondary | ICD-10-CM | POA: Insufficient documentation

## 2018-04-21 ENCOUNTER — Encounter: Payer: Self-pay | Admitting: Physician Assistant

## 2018-05-11 NOTE — Progress Notes (Signed)
[id number]  Erroneous encounter letter pending need to delete letter

## 2018-11-20 IMAGING — DX DG CHEST 2V
2 series · 2 of 2 positions shown · non-contrast
Comparison: 05/04/2017 chest radiograph.

CLINICAL DATA: Leukocytosis

EXAM:
CHEST - 2 VIEW

[chest pa]
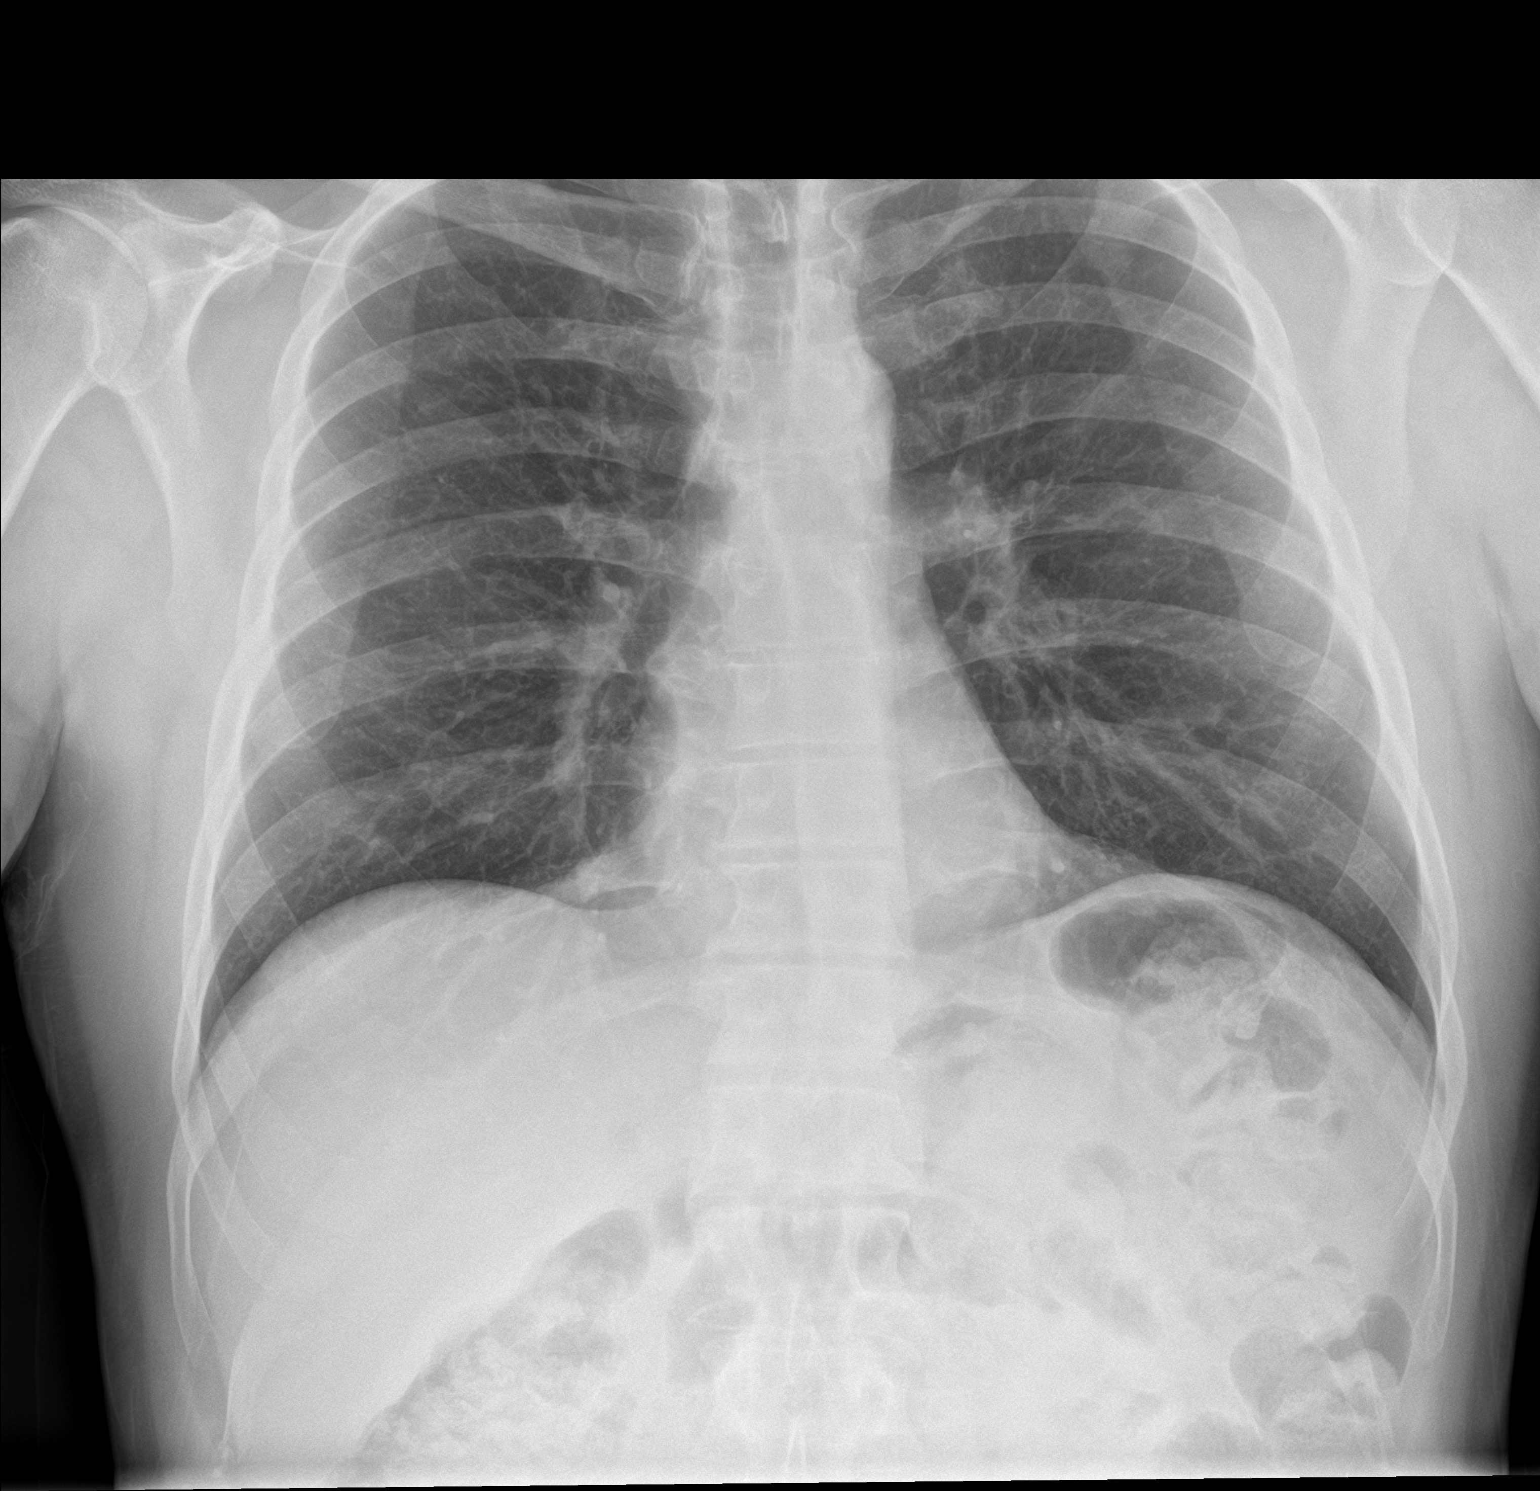

[chest lat]
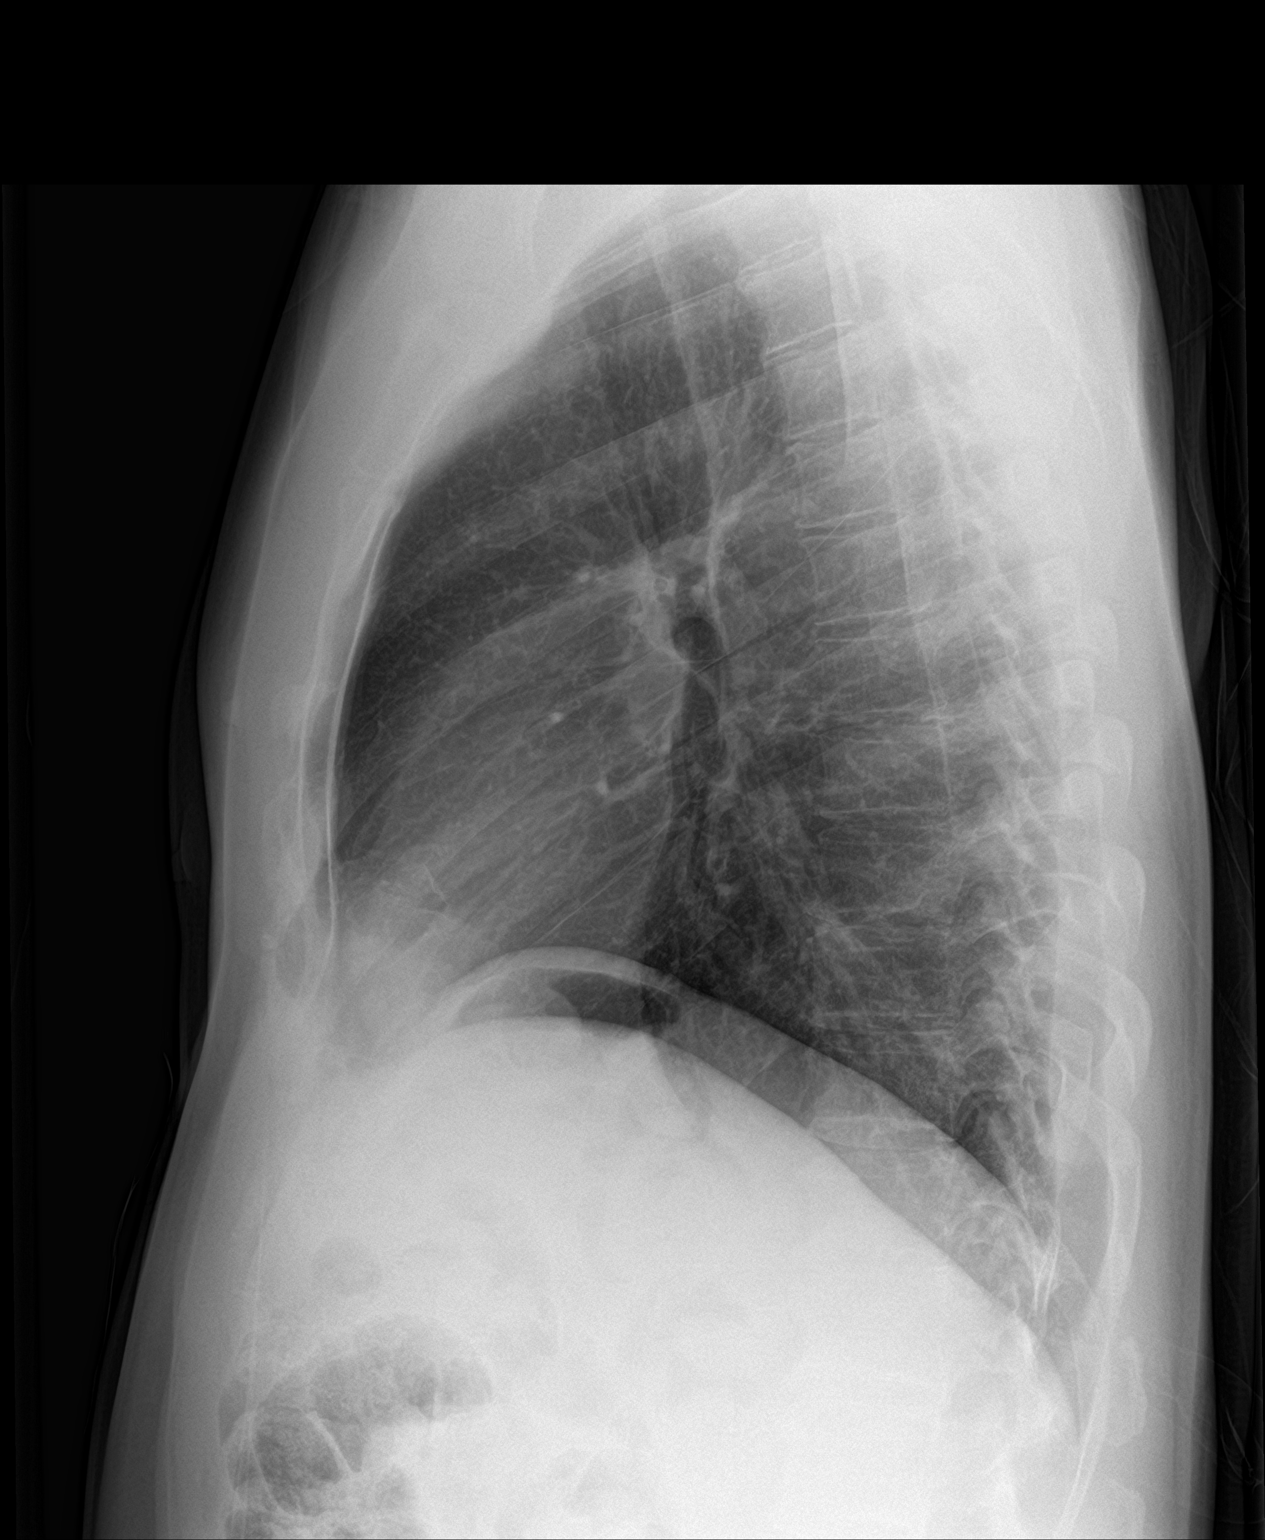

[2 of 2 positions shown; findings below may reference images not displayed]

FINDINGS: Stable cardiomediastinal silhouette with normal heart size. No
pneumothorax. No pleural effusion. Lungs appear clear, with no acute
consolidative airspace disease and no pulmonary edema.
IMPRESSION: No active cardiopulmonary disease.

## 2019-04-19 ENCOUNTER — Other Ambulatory Visit: Payer: Self-pay | Admitting: Student in an Organized Health Care Education/Training Program

## 2019-04-19 MED ORDER — DOXAZOSIN MESYLATE 4 MG PO TABS: 4.0000 mg | ORAL_TABLET | Freq: Every day | ORAL | 11 refills | Status: DC

## 2019-04-19 NOTE — Progress Notes (Signed)
Cardura refilled.

## 2019-06-09 ENCOUNTER — Other Ambulatory Visit: Payer: Self-pay

## 2019-06-09 DIAGNOSIS — Z20822 Contact with and (suspected) exposure to covid-19: Secondary | ICD-10-CM

## 2019-06-10 LAB — NOVEL CORONAVIRUS, NAA: SARS-CoV-2, NAA: NOT DETECTED

## 2020-10-05 ENCOUNTER — Encounter (HOSPITAL_COMMUNITY): Payer: Self-pay | Admitting: *Deleted

## 2020-10-05 ENCOUNTER — Emergency Department (HOSPITAL_COMMUNITY)
Admission: EM | Admit: 2020-10-05 | Discharge: 2020-10-05 | Disposition: A | Discharge status: Home / Self Care | Payer: Self-pay | Attending: Emergency Medicine | Admitting: Emergency Medicine

## 2020-10-05 ENCOUNTER — Other Ambulatory Visit: Payer: Self-pay

## 2020-10-05 DIAGNOSIS — J019 Acute sinusitis, unspecified: Secondary | ICD-10-CM | POA: Insufficient documentation

## 2020-10-05 DIAGNOSIS — H6123 Impacted cerumen, bilateral: Secondary | ICD-10-CM | POA: Insufficient documentation

## 2020-10-05 DIAGNOSIS — Z79899 Other long term (current) drug therapy: Secondary | ICD-10-CM | POA: Insufficient documentation

## 2020-10-05 DIAGNOSIS — I10 Essential (primary) hypertension: Secondary | ICD-10-CM | POA: Insufficient documentation

## 2020-10-05 DIAGNOSIS — B9689 Other specified bacterial agents as the cause of diseases classified elsewhere: Secondary | ICD-10-CM | POA: Insufficient documentation

## 2020-10-05 MED ORDER — AMOXICILLIN-POT CLAVULANATE 875-125 MG PO TABS: 1.0000 | ORAL_TABLET | Freq: Two times a day (BID) | ORAL | 0 refills | Status: AC

## 2020-10-05 MED ORDER — LORATADINE 10 MG PO TABS: 10.0000 mg | ORAL_TABLET | Freq: Every day | ORAL | 1 refills | Status: DC

## 2020-10-05 MED ORDER — AMOXICILLIN-POT CLAVULANATE 875-125 MG PO TABS: 1.0000 | ORAL_TABLET | Freq: Two times a day (BID) | ORAL | 0 refills | Status: DC

## 2020-10-05 NOTE — Discharge Instructions (Addendum)
Like we discussed, I am prescribing you 2 medications.  The first medication is an antibiotic called Augmentin. You are going to take this twice a day for the next 7 days. Do not stop taking this early even if you feel that your symptoms have improved prior to the completion of the antibiotics.  The second medication is called Claritin. This is an antihistamine similar to Benadryl but less sedating. You can take this once per day. This should help dry out your nose and hopefully help with congestion as well as sinus pain and pressure.  Continue to use your Flonase twice per day.  Please follow-up with the health department in 1 week. It was a pleasure to meet you.

## 2020-10-05 NOTE — ED Triage Notes (Signed)
Pain in sinus area for 10 days, states she has been taking an antibiotic for same

## 2020-10-05 NOTE — ED Provider Notes (Signed)
[facility] EMERGENCY DEPARTMENT Provider Note   CSN: [id number] Arrival date & time: 10/05/20  1302     History Chief Complaint  Patient presents with  . Facial Pain    Timothy Reed is a 60 y.o. male.  HPI Patient is a 61 year old male with a history of hypertension as well as bacterial sinusitis who presents the emergency department due to sinus pain and pressure. Patient states his symptoms started about 13 days ago. He states after the onset of his symptoms he was seen in the health department and started on 10 days of amoxicillin 500 mg twice daily. He states initially he noticed improvement in his symptoms but they quickly returned after 2 to 3 days. Reports associated fatigue but otherwise has no complaints. No chest pain, shortness of breath, sore throat, ear pain, fevers, chills.    Past Medical History:  Diagnosis Date  . Anxiety   . Hypertension     There are no problems to display for this patient.   History reviewed. No pertinent surgical history.     No family history on file.  Social History   Tobacco Use  . Smoking status: Never Smoker  . Smokeless tobacco: Never Used  Substance Use Topics  . Alcohol use: No  . Drug use: No    Comment: xanax    Home Medications Prior to Admission medications   Medication Sig Start Date End Date Taking? Authorizing Provider  loratadine (CLARITIN) 10 MG tablet Take 1 tablet (10 mg total) by mouth daily. 10/05/20  Yes Placido Sou, PA-C  amitriptyline (ELAVIL) 100 MG tablet Take 100 mg by mouth at bedtime.    [provider]  amoxicillin-clavulanate (AUGMENTIN) 875-125 MG tablet Take 1 tablet by mouth every 12 (twelve) hours for 10 days. 10/05/20 10/15/20  Placido Sou, PA-C  carvedilol (COREG) 6.25 MG tablet Take 6.25 mg by mouth 2 (two) times daily.    [provider]  doxazosin (CARDURA) 4 MG tablet Take 1 tablet (4 mg total) by mouth daily. 04/19/19 04/18/20  Roxanne Gates, MD   DULoxetine (CYMBALTA) 30 MG capsule Take 1 capsule (30 mg total) by mouth daily. 05/04/17   Linwood Dibbles, MD  EPINEPHrine (EPIPEN 2-PAK) 0.3 mg/0.3 mL IJ SOAJ injection Inject 0.3 mLs (0.3 mg total) into the muscle once. 05/29/14   Zadie Rhine, MD  famotidine (PEPCID) 20 MG tablet Take 1 tablet (20 mg total) by mouth 2 (two) times daily. 05/04/17   Linwood Dibbles, MD  fluticasone (FLONASE) 50 MCG/ACT nasal spray Place 1 spray into both nostrils daily. Patient taking differently: Place 1 spray into both nostrils daily as needed for allergies or rhinitis.  09/28/16   Roxy Horseman, PA-C  lisinopril-hydrochlorothiazide (PRINZIDE,ZESTORETIC) 20-12.5 MG tablet Take 1 tablet by mouth daily.    [provider]  lovastatin (MEVACOR) 20 MG tablet Take 20 mg by mouth at bedtime.    [provider]    Allergies    Patient has no known allergies.  Review of Systems   Review of Systems  Constitutional: Positive for fatigue. Negative for chills and fever.  HENT: Positive for congestion, sinus pressure and sinus pain. Negative for ear discharge, ear pain, facial swelling, sore throat and trouble swallowing.   Respiratory: Negative for cough and shortness of breath.   Cardiovascular: Negative for chest pain.    Physical Exam Updated Vital Signs BP (!) 162/96   Pulse (!) 107   Temp 97.6 F (36.4 C) (Oral)   Resp 20  Ht 5\' 9"  (1.753 m)   Wt 80.7 kg   SpO2 100%   BMI 26.29 kg/m   Physical Exam Vitals and nursing note reviewed.  Constitutional:      General: He is not in acute distress.    Appearance: Normal appearance. He is not ill-appearing, toxic-appearing or diaphoretic.  HENT:     Head: Normocephalic and atraumatic.     Right Ear: External ear normal. There is impacted cerumen.     Left Ear: External ear normal. There is impacted cerumen.     Nose: Congestion and rhinorrhea present.     Comments: Swollen erythematous nasal turbinates noted bilaterally. Pain with  palpation of the bilateral maxillary regions. No frontal sinus tenderness.    Mouth/Throat:     Mouth: Mucous membranes are moist.     Pharynx: Oropharynx is clear. No oropharyngeal exudate or posterior oropharyngeal erythema.     Comments: Uvula midline. Readily handling secretions. No significant erythema noted in the posterior oropharynx. Eyes:     General: No scleral icterus.       Right eye: No discharge.        Left eye: No discharge.     Extraocular Movements: Extraocular movements intact.     Conjunctiva/sclera: Conjunctivae normal.     Pupils: Pupils are equal, round, and reactive to light.  Cardiovascular:     Rate and Rhythm: Normal rate and regular rhythm.     Pulses: Normal pulses.     Heart sounds: Normal heart sounds. No murmur heard. No friction rub. No gallop.   Pulmonary:     Effort: Pulmonary effort is normal. No respiratory distress.     Breath sounds: Normal breath sounds. No stridor. No wheezing, rhonchi or rales.  Abdominal:     General: Abdomen is flat.     Tenderness: There is no abdominal tenderness.  Musculoskeletal:        General: Normal range of motion.     Cervical back: Normal range of motion and neck supple. No tenderness.  Skin:    General: Skin is warm and dry.  Neurological:     General: No focal deficit present.     Mental Status: He is alert and oriented to person, place, and time.  Psychiatric:        Mood and Affect: Mood normal.        Behavior: Behavior normal.    ED Results / Procedures / Treatments   Labs (all labs ordered are listed, but only abnormal results are displayed) Labs Reviewed - No data to display  EKG None  Radiology No results found.  Procedures Procedures   Medications Ordered in ED Medications - No data to display  ED Course  I have reviewed the triage vital signs and the nursing notes.  Pertinent labs & imaging results that were available during my care of the patient were reviewed by me and  considered in my medical decision making (see chart for details).    MDM Rules/Calculators/A&P                          Patient is a 60 year old male who presents the emergency department with what appears to be possible bacterial sinusitis. History of bacterial sinusitis. Day 13 of symptoms. He completed 10 days of 500 mg amoxicillin twice per day which initially helped before his sx then returned. He does have some mild tenderness along the bilateral maxillary regions. No discharge from the nares on  my exam but they are swollen and erythematous. Will discharge on Augmentin. Recommended that he continue to use Flonase. We will also start patient on Claritin. Recommended that he follow-up with the health department. Discussed return precautions. His questions were answered and he was amicable at the time of discharge.  Final Clinical Impression(s) / ED Diagnoses Final diagnoses:  Acute bacterial sinusitis   Rx / DC Orders ED Discharge Orders         Ordered    amoxicillin-clavulanate (AUGMENTIN) 875-125 MG tablet  Every 12 hours,   Status:  Discontinued        10/05/20 1343    loratadine (CLARITIN) 10 MG tablet  Daily        10/05/20 1343    amoxicillin-clavulanate (AUGMENTIN) 875-125 MG tablet  Every 12 hours        10/05/20 1353           Placido Sou, PA-C 10/05/20 1357    Bethann Berkshire, MD 10/07/20 1721

## 2021-01-16 ENCOUNTER — Encounter (HOSPITAL_COMMUNITY): Payer: Self-pay | Admitting: *Deleted

## 2021-01-16 ENCOUNTER — Other Ambulatory Visit: Payer: Self-pay

## 2021-01-16 ENCOUNTER — Emergency Department (HOSPITAL_COMMUNITY)
Admission: EM | Admit: 2021-01-16 | Discharge: 2021-01-16 | Disposition: A | Discharge status: Home / Self Care | Payer: Self-pay | Attending: Emergency Medicine | Admitting: Emergency Medicine

## 2021-01-16 DIAGNOSIS — Z79899 Other long term (current) drug therapy: Secondary | ICD-10-CM | POA: Insufficient documentation

## 2021-01-16 DIAGNOSIS — N390 Urinary tract infection, site not specified: Secondary | ICD-10-CM | POA: Insufficient documentation

## 2021-01-16 DIAGNOSIS — I10 Essential (primary) hypertension: Secondary | ICD-10-CM | POA: Insufficient documentation

## 2021-01-16 HISTORY — DX: Hyperlipidemia, unspecified: E78.5

## 2021-01-16 LAB — URINALYSIS, ROUTINE W REFLEX MICROSCOPIC
Bilirubin Urine: NEGATIVE
Glucose, UA: NEGATIVE mg/dL
Hgb urine dipstick: NEGATIVE
Ketones, ur: NEGATIVE mg/dL
Nitrite: NEGATIVE
Protein, ur: NEGATIVE mg/dL
Specific Gravity, Urine: 1.024 (ref 1.005–1.030)
pH: 7 (ref 5.0–8.0)

## 2021-01-16 MED ORDER — TAMSULOSIN HCL 0.4 MG PO CAPS
0.4000 mg | ORAL_CAPSULE | ORAL | Status: AC
  Administered 2021-01-16: 17:00:00 0.4 mg via ORAL
  Filled 2021-01-16: qty 1

## 2021-01-16 MED ORDER — CEPHALEXIN 500 MG PO CAPS: 500.0000 mg | ORAL_CAPSULE | Freq: Three times a day (TID) | ORAL | 0 refills | Status: AC

## 2021-01-16 MED ORDER — TAMSULOSIN HCL 0.4 MG PO CAPS: 0.4000 mg | ORAL_CAPSULE | Freq: Every day | ORAL | 0 refills | Status: AC

## 2021-01-16 NOTE — ED Triage Notes (Signed)
States he has been on medication for prostates issues and feels it is not working

## 2021-01-16 NOTE — ED Provider Notes (Signed)
[facility] EMERGENCY DEPARTMENT Provider Note   CSN: [id number] Arrival date & time: 01/16/21  1618     History Chief Complaint  Patient presents with   Urinary Retention    Timothy Reed is a 60 y.o. male.  HPI  This patient is a 60 year old male, he has a prior history significant for hypertension, hypercholesterolemia, some seasonal allergies, he also takes Elavil at bedtime.  He takes occasional benadryl - he does not take muscle relaxants.  He has had the problem of urinary retention for over a year - had some relief with doxazosin which works intermittently - recently has not been working as well and he is states that he has to run the water to get himself started - occasional burning - no abd pain and no f/c/n/v and no back pain - no penile d/c or penile pain - followed by the health dept.    Past Medical History:  Diagnosis Date   Anxiety    HLD (hyperlipidemia)    Hypertension     There are no problems to display for this patient.   History reviewed. No pertinent surgical history.     No family history on file.  Social History   Tobacco Use   Smoking status: Never   Smokeless tobacco: Never  Substance Use Topics   Alcohol use: No   Drug use: No    Comment: xanax    Home Medications Prior to Admission medications   Medication Sig Start Date End Date Taking? Authorizing Provider  cephALEXin (KEFLEX) 500 MG capsule Take 1 capsule (500 mg total) by mouth 3 (three) times daily for 7 days. 01/16/21 01/23/21 Yes Eber Hong, MD  tamsulosin (FLOMAX) 0.4 MG CAPS capsule Take 1 capsule (0.4 mg total) by mouth daily. 01/16/21 02/15/21 Yes Eber Hong, MD  amitriptyline (ELAVIL) 100 MG tablet Take 100 mg by mouth at bedtime.    [provider]  carvedilol (COREG) 6.25 MG tablet Take 6.25 mg by mouth 2 (two) times daily.    [provider]  DULoxetine (CYMBALTA) 30 MG capsule Take 1 capsule (30 mg total) by mouth daily. 05/04/17   Linwood Dibbles, MD   EPINEPHrine (EPIPEN 2-PAK) 0.3 mg/0.3 mL IJ SOAJ injection Inject 0.3 mLs (0.3 mg total) into the muscle once. 05/29/14   Zadie Rhine, MD  famotidine (PEPCID) 20 MG tablet Take 1 tablet (20 mg total) by mouth 2 (two) times daily. 05/04/17   Linwood Dibbles, MD  fluticasone (FLONASE) 50 MCG/ACT nasal spray Place 1 spray into both nostrils daily. Patient taking differently: Place 1 spray into both nostrils daily as needed for allergies or rhinitis.  09/28/16   Roxy Horseman, PA-C  lisinopril-hydrochlorothiazide (PRINZIDE,ZESTORETIC) 20-12.5 MG tablet Take 1 tablet by mouth daily.    [provider]  loratadine (CLARITIN) 10 MG tablet Take 1 tablet (10 mg total) by mouth daily. 10/05/20   Placido Sou, PA-C  lovastatin (MEVACOR) 20 MG tablet Take 20 mg by mouth at bedtime.    [provider]    Allergies    Patient has no known allergies.  Review of Systems   Review of Systems  Constitutional:  Negative for fever.  Gastrointestinal:  Negative for abdominal pain.  Genitourinary:  Positive for difficulty urinating. Negative for flank pain, hematuria, penile discharge, penile pain, penile swelling, scrotal swelling, testicular pain and urgency.  Musculoskeletal:  Negative for back pain.   Physical Exam Updated Vital Signs BP (!) 141/92   Pulse 97   Temp 98.2  F (36.8 C)   Resp 18   Ht 1.753 m (5\' 9" )   Wt 77.1 kg   SpO2 100%   BMI 25.10 kg/m   Physical Exam Vitals and nursing note reviewed.  Constitutional:      Appearance: He is well-developed. He is not diaphoretic.  HENT:     Head: Normocephalic and atraumatic.  Eyes:     General:        Right eye: No discharge.        Left eye: No discharge.     Conjunctiva/sclera: Conjunctivae normal.  Pulmonary:     Effort: Pulmonary effort is normal. No respiratory distress.  Genitourinary:    Comments: Chaperone present for exam, normal-appearing penis scrotum and testicles, no discharge or blood at the urethral  meatus, normal-appearing shaft, no lymphadenopathy in the groin, no hernias present Skin:    General: Skin is warm and dry.     Findings: No erythema or rash.  Neurological:     Mental Status: He is alert.     Coordination: Coordination normal.    ED Results / Procedures / Treatments   Labs (all labs ordered are listed, but only abnormal results are displayed) Labs Reviewed  URINALYSIS, ROUTINE W REFLEX MICROSCOPIC - Abnormal; Notable for the following components:      Result Value   APPearance CLOUDY (*)    Leukocytes,Ua SMALL (*)    Bacteria, UA RARE (*)    All other components within normal limits  URINE CULTURE    EKG None  Radiology No results found.  Procedures Procedures   Medications Ordered in ED Medications  tamsulosin (FLOMAX) capsule 0.4 mg (0.4 mg Oral Given 01/16/21 1708)    ED Course  I have reviewed the triage vital signs and the nursing notes.  Pertinent labs & imaging results that were available during my care of the patient were reviewed by me and considered in my medical decision making (see chart for details).  Clinical Course as of 01/16/21 1825  Thu Jan 16, 2021  Jan 18, 2021 Urinalysis reveals 21-50 white blood cells with some bacteria, vital signs are totally unremarkable, the patient will be started on Flomax, Keflex and can follow-up in the outpatient setting [BM]    Clinical Course User Index [BM] 09-16-1979, MD   MDM Rules/Calculators/A&P                          Patient already states he has a known enlarged prostate, on top of this he takes an tricyclic medication at night before bed which can have antihistamine properties and give anticholinergic symptoms such as urinary retention.  He will need to talk with his doctor about this but in the meantime we will switch him over to tamsulosin and off of doxazosin.  Check UA for infection and to measure output Pt agreeable.  Final Clinical Impression(s) / ED Diagnoses Final diagnoses:  Lower  urinary tract infectious disease    Rx / DC Orders ED Discharge Orders          Ordered    tamsulosin (FLOMAX) 0.4 MG CAPS capsule  Daily        01/16/21 1823    cephALEXin (KEFLEX) 500 MG capsule  3 times daily        01/16/21 1823             03/18/21, MD 01/16/21 1825

## 2021-01-16 NOTE — ED Notes (Signed)
mse not needed, provider at bedside.

## 2021-01-16 NOTE — Discharge Instructions (Addendum)
It appears that you do have a urinary tract infection, this is why you are having a little bit of burning and why you are having difficulty urinating.  Please take cephalexin 3 times a day for 7 days and switch over to Flomax once a day instead of doxazosin which I have discontinued  Please call your doctors office to arrange follow-up within the next week and to discuss these results, have them obtain your results to discuss neck step.  ER for worsening symptoms

## 2021-01-16 NOTE — ED Notes (Signed)
Post void residual 17 ml . Timothy Reed

## 2021-01-16 NOTE — ED Triage Notes (Signed)
Reports urinary retention x 3 years.  Pt is alert and active.  Resp even and unlabored.  Denies pain or blood in urine.

## 2021-01-18 LAB — URINE CULTURE: Culture: <10000 — AB

## 2021-05-14 ENCOUNTER — Encounter (HOSPITAL_COMMUNITY): Payer: Self-pay

## 2021-05-14 ENCOUNTER — Other Ambulatory Visit: Payer: Self-pay

## 2021-05-14 ENCOUNTER — Emergency Department (HOSPITAL_COMMUNITY)
Admission: EM | Admit: 2021-05-14 | Discharge: 2021-05-14 | Disposition: A | Discharge status: Home / Self Care | Payer: Self-pay | Attending: Emergency Medicine | Admitting: Emergency Medicine

## 2021-05-14 DIAGNOSIS — R300 Dysuria: Secondary | ICD-10-CM | POA: Insufficient documentation

## 2021-05-14 DIAGNOSIS — Z79899 Other long term (current) drug therapy: Secondary | ICD-10-CM | POA: Insufficient documentation

## 2021-05-14 DIAGNOSIS — I10 Essential (primary) hypertension: Secondary | ICD-10-CM | POA: Insufficient documentation

## 2021-05-14 LAB — URINALYSIS, ROUTINE W REFLEX MICROSCOPIC
Bilirubin Urine: NEGATIVE
Glucose, UA: NEGATIVE mg/dL
Hgb urine dipstick: NEGATIVE
Ketones, ur: NEGATIVE mg/dL
Leukocytes,Ua: NEGATIVE
Nitrite: NEGATIVE
Protein, ur: NEGATIVE mg/dL
Specific Gravity, Urine: 1.018 (ref 1.005–1.030)
pH: 8 (ref 5.0–8.0)

## 2021-05-14 MED ORDER — CEPHALEXIN 500 MG PO CAPS: 500.0000 mg | ORAL_CAPSULE | Freq: Four times a day (QID) | ORAL | 0 refills | Status: DC

## 2021-05-14 NOTE — Discharge Instructions (Addendum)
Take Keflex 4 times daily for the next 5 days. Return to the ED if you have any , vomiting, abdominal pain, fevers, or symptoms significantly change or worsen.

## 2021-05-14 NOTE — ED Triage Notes (Signed)
Used OTC UTI detector which was +, burning during urination. Denies discharge.

## 2021-05-14 NOTE — ED Provider Notes (Signed)
[facility] EMERGENCY DEPARTMENT Provider Note   CSN: [id number] Arrival date & time: 05/14/21  1607     History Chief Complaint  Patient presents with   Dysuria    Timothy Reed is a 60 y.o. male.   Dysuria Presenting symptoms: dysuria   Associated symptoms: no abdominal pain, no fever, no hematuria, no nausea, no urinary frequency and no vomiting    Patient presents with dysuria x2 to 3 days.  States it started acutely, its been constant.  There is no urgency or frequency, no abdominal pain nausea vomiting or fevers.  History of the same, feels this is like that.  No hematuria or flank pain.  Patient has not had a fever.  He has not tried any alleviating factors, states urinating is worsening factor.  Denies penile discharge.  Past Medical History:  Diagnosis Date   Anxiety    HLD (hyperlipidemia)    Hypertension     There are no problems to display for this patient.   History reviewed. No pertinent surgical history.     History reviewed. No pertinent family history.  Social History   Tobacco Use   Smoking status: Never   Smokeless tobacco: Never  Substance Use Topics   Alcohol use: No   Drug use: No    Comment: xanax    Home Medications Prior to Admission medications   Medication Sig Start Date End Date Taking? Authorizing Provider  amitriptyline (ELAVIL) 100 MG tablet Take 100 mg by mouth at bedtime.    [provider]  carvedilol (COREG) 6.25 MG tablet Take 6.25 mg by mouth 2 (two) times daily.    [provider]  DULoxetine (CYMBALTA) 30 MG capsule Take 1 capsule (30 mg total) by mouth daily. 05/04/17   Linwood Dibbles, MD  EPINEPHrine (EPIPEN 2-PAK) 0.3 mg/0.3 mL IJ SOAJ injection Inject 0.3 mLs (0.3 mg total) into the muscle once. 05/29/14   Zadie Rhine, MD  famotidine (PEPCID) 20 MG tablet Take 1 tablet (20 mg total) by mouth 2 (two) times daily. 05/04/17   Linwood Dibbles, MD  fluticasone (FLONASE) 50 MCG/ACT nasal spray Place 1 spray  into both nostrils daily. Patient taking differently: Place 1 spray into both nostrils daily as needed for allergies or rhinitis.  09/28/16   Roxy Horseman, PA-C  lisinopril-hydrochlorothiazide (PRINZIDE,ZESTORETIC) 20-12.5 MG tablet Take 1 tablet by mouth daily.    [provider]  loratadine (CLARITIN) 10 MG tablet Take 1 tablet (10 mg total) by mouth daily. 10/05/20   Placido Sou, PA-C  lovastatin (MEVACOR) 20 MG tablet Take 20 mg by mouth at bedtime.    [provider]    Allergies    Patient has no known allergies.  Review of Systems   Review of Systems  Constitutional:  Negative for fever.  Respiratory:  Negative for shortness of breath.   Cardiovascular:  Negative for chest pain.  Gastrointestinal:  Negative for abdominal pain, nausea and vomiting.  Genitourinary:  Positive for dysuria. Negative for frequency and hematuria.   Physical Exam Updated Vital Signs BP (!) 141/100 (BP Location: Right Arm)   Pulse 99   Temp 98.4 F (36.9 C)   Resp 16   Ht 5\' 9"  (1.753 m)   Wt 72.6 kg   SpO2 99%   BMI 23.63 kg/m   Physical Exam Vitals and nursing note reviewed. Exam conducted with a chaperone present.  Constitutional:      General: He is not in acute distress.    Appearance:  Normal appearance.  HENT:     Head: Normocephalic and atraumatic.  Eyes:     General: No scleral icterus.    Extraocular Movements: Extraocular movements intact.     Pupils: Pupils are equal, round, and reactive to light.  Abdominal:     Comments: Abdomen is soft and nontender.  Skin:    Coloration: Skin is not jaundiced.  Neurological:     Mental Status: He is alert. Mental status is at baseline.     Coordination: Coordination normal.    ED Results / Procedures / Treatments   Labs (all labs ordered are listed, but only abnormal results are displayed) Labs Reviewed  URINE CULTURE  URINALYSIS, ROUTINE W REFLEX MICROSCOPIC    EKG None  Radiology No results  found.  Procedures Procedures   Medications Ordered in ED Medications - No data to display  ED Course  I have reviewed the triage vital signs and the nursing notes.  Pertinent labs & imaging results that were available during my care of the patient were reviewed by me and considered in my medical decision making (see chart for details).    MDM Rules/Calculators/A&P                           Vital stable, patient with dysuria without hematuria.  No flank pain, no abdominal pain.  Doubt Pilo given no vomiting or fever, doubt kidney stone given no hematuria or flank pain.  Doubt acute abdomen given soft abdomen without abdominal pain.  Will check urine and proceed from there.  UA negative for UTI.  No hematuria that be concerning for kidney stone, especially in the setting of no flank pain.  Patient is still symptomatic as if he has a UTI, we will proceed with urine culture and treat empirically with Keflex.  Final Clinical Impression(s) / ED Diagnoses Final diagnoses:  None    Rx / DC Orders ED Discharge Orders     None        Theron Arista, PA-C 05/14/21 1825    Vanetta Mulders, MD 05/20/21 1536

## 2021-05-16 LAB — URINE CULTURE: Culture: NO GROWTH

## 2023-03-29 ENCOUNTER — Emergency Department (HOSPITAL_COMMUNITY)
Admission: EM | Admit: 2023-03-29 | Discharge: 2023-03-29 | Disposition: A | Discharge status: Home / Self Care | Payer: Medicaid Other | Attending: Emergency Medicine | Admitting: Emergency Medicine

## 2023-03-29 ENCOUNTER — Other Ambulatory Visit: Payer: Self-pay

## 2023-03-29 ENCOUNTER — Emergency Department (HOSPITAL_COMMUNITY): Payer: Medicaid Other

## 2023-03-29 ENCOUNTER — Encounter (HOSPITAL_COMMUNITY): Payer: Self-pay | Admitting: *Deleted

## 2023-03-29 DIAGNOSIS — S060X0A Concussion without loss of consciousness, initial encounter: Secondary | ICD-10-CM | POA: Diagnosis not present

## 2023-03-29 DIAGNOSIS — W01198A Fall on same level from slipping, tripping and stumbling with subsequent striking against other object, initial encounter: Secondary | ICD-10-CM | POA: Insufficient documentation

## 2023-03-29 DIAGNOSIS — Z79899 Other long term (current) drug therapy: Secondary | ICD-10-CM | POA: Insufficient documentation

## 2023-03-29 DIAGNOSIS — S0990XA Unspecified injury of head, initial encounter: Secondary | ICD-10-CM | POA: Diagnosis present

## 2023-03-29 DIAGNOSIS — I10 Essential (primary) hypertension: Secondary | ICD-10-CM | POA: Insufficient documentation

## 2023-03-29 MED ORDER — MECLIZINE HCL 12.5 MG PO TABS: 12.5000 mg | ORAL_TABLET | Freq: Three times a day (TID) | ORAL | 0 refills | Status: DC | PRN

## 2023-03-29 NOTE — ED Notes (Signed)
Pt ambulated to the restroom with no difficulties.

## 2023-03-29 NOTE — ED Triage Notes (Signed)
Pt states he hit his head on a cement block x 2 days ago and has since had gait trouble; pt denies any n/v or LOC at time of injury  Pt c/o slight headache

## 2023-03-29 NOTE — Discharge Instructions (Signed)
CT imaging looks fine.  Your symptoms are consistent with a concussion.  Take ibuprofen and Tylenol as needed for headaches.  A prescription for medication to treat dizziness and nausea was sent to your pharmacy.  Take as needed.  Avoid activities that worsen your symptoms.  Avoid further injury while your brain is healing.

## 2023-03-29 NOTE — ED Provider Notes (Signed)
[place] EMERGENCY DEPARTMENT AT [facility] Provider Note   CSN: [id number] Arrival date & time: 03/29/23  1249     History  Chief Complaint  Patient presents with   Dizziness    Timothy Reed is a 62 y.o. male.   Dizziness Associated symptoms: headaches   Patient presents for dizziness and headache.  Medical history includes anxiety, HLD, HTN.  3 days ago, he had a mechanical fall.  At the time, he struck the left parietal aspect of his scalp on a cinderblock wall.  He did not have any breaks in the skin.  He did not develop any significant swelling to the area.  Since that time, he has had intermittent headache and dizziness.  He currently denies any symptoms.  He is not on a blood thinner.     Home Medications Prior to Admission medications   Medication Sig Start Date End Date Taking? Authorizing Provider  meclizine (ANTIVERT) 12.5 MG tablet Take 1 tablet (12.5 mg total) by mouth 3 (three) times daily as needed for dizziness. 03/29/23  Yes Gloris Manchester, MD  amitriptyline (ELAVIL) 100 MG tablet Take 100 mg by mouth at bedtime.    [provider]  carvedilol (COREG) 6.25 MG tablet Take 6.25 mg by mouth 2 (two) times daily.    [provider]  cephALEXin (KEFLEX) 500 MG capsule Take 1 capsule (500 mg total) by mouth 4 (four) times daily. 05/14/21   Theron Arista, PA-C  DULoxetine (CYMBALTA) 30 MG capsule Take 1 capsule (30 mg total) by mouth daily. 05/04/17   Linwood Dibbles, MD  EPINEPHrine (EPIPEN 2-PAK) 0.3 mg/0.3 mL IJ SOAJ injection Inject 0.3 mLs (0.3 mg total) into the muscle once. 05/29/14   Zadie Rhine, MD  famotidine (PEPCID) 20 MG tablet Take 1 tablet (20 mg total) by mouth 2 (two) times daily. 05/04/17   Linwood Dibbles, MD  fluticasone (FLONASE) 50 MCG/ACT nasal spray Place 1 spray into both nostrils daily. Patient taking differently: Place 1 spray into both nostrils daily as needed for allergies or rhinitis.  09/28/16   Roxy Horseman, PA-C   lisinopril-hydrochlorothiazide (PRINZIDE,ZESTORETIC) 20-12.5 MG tablet Take 1 tablet by mouth daily.    [provider]  loratadine (CLARITIN) 10 MG tablet Take 1 tablet (10 mg total) by mouth daily. 10/05/20   Placido Sou, PA-C  lovastatin (MEVACOR) 20 MG tablet Take 20 mg by mouth at bedtime.    [provider]      Allergies    Patient has no known allergies.    Review of Systems   Review of Systems  Neurological:  Positive for dizziness and headaches.  All other systems reviewed and are negative.   Physical Exam Updated Vital Signs BP (!) 145/95   Pulse (!) 111   Temp 98.5 F (36.9 C) (Oral)   Resp 18   Ht 5\' 9"  (1.753 m)   Wt 77.1 kg   SpO2 99%   BMI 25.10 kg/m  Physical Exam Vitals and nursing note reviewed.  Constitutional:      General: He is not in acute distress.    Appearance: Normal appearance. He is well-developed. He is not ill-appearing, toxic-appearing or diaphoretic.  HENT:     Head: Normocephalic and atraumatic.     Right Ear: External ear normal.     Left Ear: External ear normal.     Nose: Nose normal.     Mouth/Throat:     Mouth: Mucous membranes are moist.  Eyes:  Extraocular Movements: Extraocular movements intact.     Conjunctiva/sclera: Conjunctivae normal.  Cardiovascular:     Rate and Rhythm: Normal rate and regular rhythm.  Pulmonary:     Effort: Pulmonary effort is normal. No respiratory distress.  Abdominal:     General: Abdomen is flat. There is no distension.  Musculoskeletal:        General: No swelling. Normal range of motion.     Cervical back: Normal range of motion and neck supple.  Skin:    General: Skin is warm and dry.     Capillary Refill: Capillary refill takes less than 2 seconds.     Coloration: Skin is not jaundiced or pale.  Neurological:     General: No focal deficit present.     Mental Status: He is alert and oriented to person, place, and time.     Cranial Nerves: No cranial nerve  deficit.     Sensory: No sensory deficit.     Motor: No weakness.     Coordination: Coordination normal.     Gait: Gait normal.  Psychiatric:        Mood and Affect: Mood normal.        Behavior: Behavior normal.     ED Results / Procedures / Treatments   Labs (all labs ordered are listed, but only abnormal results are displayed) Labs Reviewed - No data to display  EKG None  Radiology CT Head Wo Contrast  Result Date: 03/29/2023 CLINICAL DATA:  Head trauma, moderate-severe; Neck trauma, dangerous injury mechanism (Age 64-64y) EXAM: CT HEAD WITHOUT CONTRAST CT CERVICAL SPINE WITHOUT CONTRAST TECHNIQUE: Multidetector CT imaging of the head and cervical spine was performed following the standard protocol without intravenous contrast. Multiplanar CT image reconstructions of the cervical spine were also generated. RADIATION DOSE REDUCTION: This exam was performed according to the departmental dose-optimization program which includes automated exposure control, adjustment of the mA and/or kV according to patient size and/or use of iterative reconstruction technique. COMPARISON:  07/10/2013 FINDINGS: CT HEAD FINDINGS Brain: No evidence of acute infarction, hemorrhage, hydrocephalus, extra-axial collection or mass lesion/mass effect. Vascular: No hyperdense vessel or unexpected calcification. Skull: Normal. Negative for fracture or focal lesion. Sinuses/Orbits: No acute finding. Other: Negative for scalp hematoma. CT CERVICAL SPINE FINDINGS Alignment: Facet joints are aligned without dislocation or traumatic listhesis. Dens and lateral masses are aligned. Skull base and vertebrae: No acute fracture. No primary bone lesion or focal pathologic process. Soft tissues and spinal canal: No prevertebral fluid or swelling. No visible canal hematoma. Disc levels: Degenerative disc disease of C4-5 and C5-6. Facet arthropathy is most pronounced on the right at C3-4. Upper chest: Included lung apices are clear.  Other: None. IMPRESSION: 1. No acute intracranial abnormality. 2. No acute fracture or subluxation of the cervical spine. Electronically Signed   By: Duanne Guess D.O.   On: 03/29/2023 17:33   CT Cervical Spine Wo Contrast  Result Date: 03/29/2023 CLINICAL DATA:  Head trauma, moderate-severe; Neck trauma, dangerous injury mechanism (Age 11-64y) EXAM: CT HEAD WITHOUT CONTRAST CT CERVICAL SPINE WITHOUT CONTRAST TECHNIQUE: Multidetector CT imaging of the head and cervical spine was performed following the standard protocol without intravenous contrast. Multiplanar CT image reconstructions of the cervical spine were also generated. RADIATION DOSE REDUCTION: This exam was performed according to the departmental dose-optimization program which includes automated exposure control, adjustment of the mA and/or kV according to patient size and/or use of iterative reconstruction technique. COMPARISON:  07/10/2013 FINDINGS: CT HEAD FINDINGS Brain: No  evidence of acute infarction, hemorrhage, hydrocephalus, extra-axial collection or mass lesion/mass effect. Vascular: No hyperdense vessel or unexpected calcification. Skull: Normal. Negative for fracture or focal lesion. Sinuses/Orbits: No acute finding. Other: Negative for scalp hematoma. CT CERVICAL SPINE FINDINGS Alignment: Facet joints are aligned without dislocation or traumatic listhesis. Dens and lateral masses are aligned. Skull base and vertebrae: No acute fracture. No primary bone lesion or focal pathologic process. Soft tissues and spinal canal: No prevertebral fluid or swelling. No visible canal hematoma. Disc levels: Degenerative disc disease of C4-5 and C5-6. Facet arthropathy is most pronounced on the right at C3-4. Upper chest: Included lung apices are clear. Other: None. IMPRESSION: 1. No acute intracranial abnormality. 2. No acute fracture or subluxation of the cervical spine. Electronically Signed   By: Duanne Guess D.O.   On: 03/29/2023 17:33     Procedures Procedures    Medications Ordered in ED Medications - No data to display  ED Course/ Medical Decision Making/ A&P                                 Medical Decision Making Amount and/or Complexity of Data Reviewed Radiology: ordered.   Patient presents for head injury.  This occurred 3 days ago.  He has had intermittent symptoms of headache and dizziness.  He denies any currently.  On exam, he is well-appearing.  Has no focal neurologic deficits.  He has no current gait imbalance.  Symptoms are consistent with concussion.  Will obtain CT imaging to rule out fractures and ICH.  Imaging did not show acute findings.  Patient was counseled on postconcussive syndrome.  He was discharged in stable condition.        Final Clinical Impression(s) / ED Diagnoses Final diagnoses:  Concussion without loss of consciousness, initial encounter    Rx / DC Orders ED Discharge Orders          Ordered    meclizine (ANTIVERT) 12.5 MG tablet  3 times daily PRN        03/29/23 1744              Gloris Manchester, MD 03/29/23 1744

## 2023-08-12 ENCOUNTER — Encounter (HOSPITAL_COMMUNITY): Payer: Self-pay | Admitting: Emergency Medicine

## 2023-08-12 ENCOUNTER — Other Ambulatory Visit: Payer: Self-pay

## 2023-08-12 ENCOUNTER — Emergency Department (HOSPITAL_COMMUNITY)
Admission: EM | Admit: 2023-08-12 | Discharge: 2023-08-12 | Discharge status: Against Medical Advice | Payer: Medicaid Other | Attending: Emergency Medicine | Admitting: Emergency Medicine

## 2023-08-12 DIAGNOSIS — R519 Headache, unspecified: Secondary | ICD-10-CM | POA: Diagnosis present

## 2023-08-12 DIAGNOSIS — Z5321 Procedure and treatment not carried out due to patient leaving prior to being seen by health care provider: Secondary | ICD-10-CM | POA: Insufficient documentation

## 2023-08-12 HISTORY — DX: Gastro-esophageal reflux disease without esophagitis: K21.9

## 2023-08-12 NOTE — ED Notes (Signed)
 Refused labs.

## 2023-08-12 NOTE — ED Triage Notes (Signed)
 Pt had concussion 6 weeks ago. States still having some symptoms of it, intermittent sensitivity to light, eyes watery, getting tired easily, headaches.  Pt states only ss at this time is that he feels tired. Nad. Ambulatory. Color wnl.

## 2023-08-12 NOTE — ED Notes (Signed)
 Called for labs X 2, no answer.

## 2023-08-13 ENCOUNTER — Emergency Department (HOSPITAL_COMMUNITY)
Admission: EM | Admit: 2023-08-13 | Discharge: 2023-08-13 | Discharge status: Against Medical Advice | Payer: Medicaid Other | Source: Home / Self Care

## 2023-08-13 ENCOUNTER — Other Ambulatory Visit: Payer: Self-pay

## 2023-08-13 ENCOUNTER — Encounter (HOSPITAL_COMMUNITY): Payer: Self-pay

## 2023-08-13 ENCOUNTER — Emergency Department (HOSPITAL_COMMUNITY)
Admission: EM | Admit: 2023-08-13 | Discharge: 2023-08-13 | Disposition: A | Discharge status: Home / Self Care | Payer: Medicaid Other | Attending: Emergency Medicine | Admitting: Emergency Medicine

## 2023-08-13 ENCOUNTER — Emergency Department (HOSPITAL_COMMUNITY): Payer: Medicaid Other

## 2023-08-13 DIAGNOSIS — I10 Essential (primary) hypertension: Secondary | ICD-10-CM | POA: Diagnosis not present

## 2023-08-13 DIAGNOSIS — I1A0 Resistant hypertension: Secondary | ICD-10-CM | POA: Insufficient documentation

## 2023-08-13 DIAGNOSIS — R519 Headache, unspecified: Secondary | ICD-10-CM | POA: Diagnosis present

## 2023-08-13 DIAGNOSIS — R42 Dizziness and giddiness: Secondary | ICD-10-CM

## 2023-08-13 DIAGNOSIS — Z79899 Other long term (current) drug therapy: Secondary | ICD-10-CM | POA: Insufficient documentation

## 2023-08-13 LAB — CBC WITH DIFFERENTIAL/PLATELET
Abs Immature Granulocytes: 0.03 10*3/uL (ref 0.00–0.07)
Basophils Absolute: 0.1 10*3/uL (ref 0.0–0.1)
Basophils Relative: 1 %
Eosinophils Absolute: 0.1 10*3/uL (ref 0.0–0.5)
Eosinophils Relative: 1 %
HCT: 47.4 % (ref 39.0–52.0)
Hemoglobin: 16.1 g/dL (ref 13.0–17.0)
Immature Granulocytes: 0 %
Lymphocytes Relative: 15 %
Lymphs Abs: 1.1 10*3/uL (ref 0.7–4.0)
MCH: 30.5 pg (ref 26.0–34.0)
MCHC: 34 g/dL (ref 30.0–36.0)
MCV: 89.8 fL (ref 80.0–100.0)
Monocytes Absolute: 0.6 10*3/uL (ref 0.1–1.0)
Monocytes Relative: 8 %
Neutro Abs: 5.8 10*3/uL (ref 1.7–7.7)
Neutrophils Relative %: 75 %
Platelets: 253 10*3/uL (ref 150–400)
RBC: 5.28 MIL/uL (ref 4.22–5.81)
RDW: 12.9 % (ref 11.5–15.5)
WBC: 7.8 10*3/uL (ref 4.0–10.5)
nRBC: 0 % (ref 0.0–0.2)

## 2023-08-13 LAB — BASIC METABOLIC PANEL WITH GFR
Anion gap: 9 (ref 5–15)
BUN: 18 mg/dL (ref 8–23)
CO2: 29 mmol/L (ref 22–32)
Calcium: 9.3 mg/dL (ref 8.9–10.3)
Chloride: 98 mmol/L (ref 98–111)
Creatinine, Ser: 1.17 mg/dL (ref 0.61–1.24)
GFR, Estimated: >60 mL/min (ref >60)
Glucose, Bld: 123 mg/dL — ABNORMAL HIGH (ref 70–99)
Potassium: 4.1 mmol/L (ref 3.5–5.1)
Sodium: 136 mmol/L (ref 135–145)

## 2023-08-13 MED ORDER — LISINOPRIL-HYDROCHLOROTHIAZIDE 20-25 MG PO TABS: 1.0000 | ORAL_TABLET | Freq: Once | ORAL | Status: DC

## 2023-08-13 MED ORDER — LISINOPRIL 10 MG PO TABS
20.0000 mg | ORAL_TABLET | Freq: Once | ORAL | Status: AC
  Administered 2023-08-13: 20 mg via ORAL
  Filled 2023-08-13: qty 2

## 2023-08-13 MED ORDER — CARVEDILOL 3.125 MG PO TABS
6.2500 mg | ORAL_TABLET | Freq: Once | ORAL | Status: AC
  Administered 2023-08-13: 6.25 mg via ORAL
  Filled 2023-08-13: qty 2

## 2023-08-13 MED ORDER — HYDROCHLOROTHIAZIDE 25 MG PO TABS
25.0000 mg | ORAL_TABLET | Freq: Once | ORAL | Status: AC
  Administered 2023-08-13: 25 mg via ORAL
  Filled 2023-08-13: qty 1

## 2023-08-13 NOTE — ED Triage Notes (Signed)
 Pt BIB ems for hypertension. Per EMS highest reading they got was 168/80.

## 2023-08-13 NOTE — Discharge Instructions (Signed)
 Please continue your regular medications and keep a log of your blood pressures.  Follow-up with your primary care doctor as they may need to make some medication adjustments.  Return if any worsening or concerning symptoms

## 2023-08-13 NOTE — ED Provider Notes (Signed)
 [place] EMERGENCY DEPARTMENT AT [facility] Provider Note   CSN: [id number] Arrival date & time: 08/13/23  0703     History  Chief Complaint  Patient presents with   Hypertension    Timothy Reed is a 63 y.o. male.  He is here with a few complaints.  He said his blood pressure has been elevated for the last 2 or 3 days.  Normally 140s but he said it has been in the 160s.  He also wants to know if his concussion is resolved.  He said he has had multiple concussions in the past and the last one was a few months ago.  He still having intermittent fatigue and dizziness.  No blurry vision double vision numbness or weakness.  No chest pain or shortness of breath.  No nausea or vomiting.  He has a primary care doctor but he says he only sees them once or twice a year.  He has been taking his blood pressure medications.  The history is provided by the patient.  Hypertension The current episode started more than 2 days ago. The problem occurs constantly. The problem has not changed since onset.Associated symptoms include headaches. Pertinent negatives include no chest pain, no abdominal pain and no shortness of breath. Nothing aggravates the symptoms. Nothing relieves the symptoms. He has tried nothing for the symptoms. The treatment provided no relief.       Home Medications Prior to Admission medications   Medication Sig Start Date End Date Taking? Authorizing Provider  amitriptyline (ELAVIL) 100 MG tablet Take 100 mg by mouth at bedtime.    [provider]  carvedilol  (COREG ) 6.25 MG tablet Take 6.25 mg by mouth 2 (two) times daily.    [provider]  cephALEXin  (KEFLEX ) 500 MG capsule Take 1 capsule (500 mg total) by mouth 4 (four) times daily. 05/14/21   Emelia Sluder, PA-C  DULoxetine  (CYMBALTA ) 30 MG capsule Take 1 capsule (30 mg total) by mouth daily. 05/04/17   Randol Simmonds, MD  EPINEPHrine  (EPIPEN  2-PAK) 0.3 mg/0.3 mL IJ SOAJ injection Inject 0.3 mLs (0.3  mg total) into the muscle once. 05/29/14   Midge Golas, MD  famotidine  (PEPCID ) 20 MG tablet Take 1 tablet (20 mg total) by mouth 2 (two) times daily. 05/04/17   Randol Simmonds, MD  fluticasone  (FLONASE ) 50 MCG/ACT nasal spray Place 1 spray into both nostrils daily. Patient taking differently: Place 1 spray into both nostrils daily as needed for allergies or rhinitis.  09/28/16   Vicky Charleston, PA-C  lisinopril -hydrochlorothiazide  (PRINZIDE ,ZESTORETIC ) 20-12.5 MG tablet Take 1 tablet by mouth daily.    [provider]  loratadine  (CLARITIN ) 10 MG tablet Take 1 tablet (10 mg total) by mouth daily. 10/05/20   Joldersma, Logan, PA-C  lovastatin (MEVACOR) 20 MG tablet Take 20 mg by mouth at bedtime.    [provider]  meclizine  (ANTIVERT ) 12.5 MG tablet Take 1 tablet (12.5 mg total) by mouth 3 (three) times daily as needed for dizziness. 03/29/23   Melvenia Motto, MD      Allergies    Patient has no known allergies.    Review of Systems   Review of Systems  Constitutional:  Positive for fatigue. Negative for fever.  Eyes:  Negative for visual disturbance.  Respiratory:  Negative for shortness of breath.   Cardiovascular:  Negative for chest pain.  Gastrointestinal:  Negative for abdominal pain.  Genitourinary:  Negative for dysuria.  Neurological:  Positive for dizziness, light-headedness and headaches.  Physical Exam Updated Vital Signs BP (!) 163/105   Pulse 89   Temp 98.5 F (36.9 C) (Oral)   Resp 16   Ht 5' 9 (1.753 m)   Wt 77.1 kg   SpO2 100%   BMI 25.10 kg/m  Physical Exam Vitals and nursing note reviewed.  Constitutional:      General: He is not in acute distress.    Appearance: Normal appearance. He is well-developed.  HENT:     Head: Normocephalic and atraumatic.  Eyes:     Conjunctiva/sclera: Conjunctivae normal.  Cardiovascular:     Rate and Rhythm: Normal rate and regular rhythm.     Heart sounds: No murmur heard. Pulmonary:     Effort:  Pulmonary effort is normal. No respiratory distress.     Breath sounds: Normal breath sounds.  Abdominal:     Palpations: Abdomen is soft.     Tenderness: There is no abdominal tenderness. There is no guarding or rebound.  Musculoskeletal:     Cervical back: Neck supple.     Right lower leg: No edema.     Left lower leg: No edema.  Skin:    General: Skin is warm and dry.     Capillary Refill: Capillary refill takes less than 2 seconds.  Neurological:     General: No focal deficit present.     Mental Status: He is alert and oriented to person, place, and time.     Cranial Nerves: No cranial nerve deficit.     Sensory: No sensory deficit.     Motor: No weakness.     ED Results / Procedures / Treatments   Labs (all labs ordered are listed, but only abnormal results are displayed) Labs Reviewed  BASIC METABOLIC PANEL - Abnormal; Notable for the following components:      Result Value   Glucose, Bld 123 (*)    All other components within normal limits  CBC WITH DIFFERENTIAL/PLATELET    EKG EKG Interpretation Date/Time:  Friday August 13 2023 07:12:46 EST Ventricular Rate:  94 PR Interval:  175 QRS Duration:  89 QT Interval:  352 QTC Calculation: 441 R Axis:   70  Text Interpretation: Sinus rhythm Probable left atrial enlargement No significant change since prior 9/18 Confirmed by Towana Sharper [phone]) on 08/13/2023 7:16:13 AM  Radiology CT Head Wo Contrast Result Date: 08/13/2023 CLINICAL DATA:  Headache, new onset.  Hypertension. EXAM: CT HEAD WITHOUT CONTRAST TECHNIQUE: Contiguous axial images were obtained from the base of the skull through the vertex without intravenous contrast. RADIATION DOSE REDUCTION: This exam was performed according to the departmental dose-optimization program which includes automated exposure control, adjustment of the mA and/or kV according to patient size and/or use of iterative reconstruction technique. COMPARISON:  Head CT 05/29/2023.  FINDINGS: Brain: No acute intracranial hemorrhage. Gray-white differentiation is preserved. No hydrocephalus or extra-axial collection. No mass effect or midline shift. Vascular: No hyperdense vessel or unexpected calcification. Skull: No calvarial fracture or suspicious bone lesion. Skull base is unremarkable. Sinuses/Orbits: No acute finding. Other: None. IMPRESSION: No acute intracranial abnormality. Electronically Signed   By: Ryan Chess M.D.   On: 08/13/2023 10:11    Procedures Procedures    Medications Ordered in ED Medications  carvedilol  (COREG ) tablet 6.25 mg (6.25 mg Oral Given 08/13/23 0939)  lisinopril  (ZESTRIL ) tablet 20 mg (20 mg Oral Given 08/13/23 0939)    And  hydrochlorothiazide  (HYDRODIURIL ) tablet 25 mg (25 mg Oral Given 08/13/23 9060)    ED Course/ Medical  Decision Making/ A&P Clinical Course as of 08/13/23 1732  [place] Aug 13, 2023  1017 Patient's workup has been fairly unremarkable.  Blood pressure still up here although no evidence of endorgan damage.  Recommended he continue to monitor his blood pressure at home and he has an appointment with his PCP coming up next month.  Return instructions discussed [MB]    Clinical Course User Index [MB] Towana Ozell BROCKS, MD                                 Medical Decision Making Amount and/or Complexity of Data Reviewed Labs: ordered. Radiology: ordered.  Risk Prescription drug management.   This patient complains of elevated blood pressure dizziness headaches; this involves an extensive number of treatment Options and is a complaint that carries with it a high risk of complications and morbidity. The differential includes hypertension, hypertensive emergency/urgency, stroke, bleed, tumor, mass  I ordered, reviewed and interpreted labs, which included CBC normal chemistries normal other than mildly elevated glucose I ordered medication patient's home blood pressure medication and reviewed PMP when indicated. I  ordered imaging studies which included CT head and I independently    visualized and interpreted imaging which showed no acute findings Previous records obtained and reviewed in epic, patient has a few other ED visits for similar presentation Cardiac monitoring reviewed, sinus rhythm Social determinants considered, no significant barriers Critical Interventions: None  After the interventions stated above, I reevaluated the patient and found patient awake alert no distress Admission and further testing considered, no indications for admission or further workup at this time.  Will hold off on starting a new antihypertensive medicine and recommend patient closely follow-up with PCP.  Return instructions discussed         Final Clinical Impression(s) / ED Diagnoses Final diagnoses:  Resistant hypertension  Dizziness    Rx / DC Orders ED Discharge Orders     None         Towana Ozell BROCKS, MD 08/13/23 1735

## 2023-08-15 ENCOUNTER — Emergency Department (HOSPITAL_COMMUNITY)
Admission: EM | Admit: 2023-08-15 | Discharge: 2023-08-18 | Discharge status: Against Medical Advice | Payer: Medicaid Other | Attending: Emergency Medicine | Admitting: Emergency Medicine

## 2023-08-15 DIAGNOSIS — I10 Essential (primary) hypertension: Secondary | ICD-10-CM | POA: Insufficient documentation

## 2023-08-15 DIAGNOSIS — R5383 Other fatigue: Secondary | ICD-10-CM | POA: Diagnosis not present

## 2023-08-15 DIAGNOSIS — R519 Headache, unspecified: Secondary | ICD-10-CM | POA: Diagnosis present

## 2023-08-15 DIAGNOSIS — Z5321 Procedure and treatment not carried out due to patient leaving prior to being seen by health care provider: Secondary | ICD-10-CM | POA: Insufficient documentation

## 2023-08-15 NOTE — ED Provider Triage Note (Signed)
 Emergency Medicine Provider Triage Evaluation Note  Timothy Reed , a 63 y.o. male  was evaluated in triage.  Pt complains of concussion Sx x 8wks.  Review of Systems  Positive: Eye watering, photophobia, irritability, fatigue, intermittent blurred vision Negative: N/V, HA,   Physical Exam  BP (!) 140/100 (BP Location: Right Arm)   Pulse (!) 106   Temp 98.4 F (36.9 C)   Resp 16   SpO2 99%  Gen:   Awake, no distress   Resp:  Normal effort  MSK:   Moves extremities without difficulty  Other:    Medical Decision Making  Medically screening exam initiated at 2:55 PM.  Appropriate orders placed.  Dmonte R Axelrod was informed that the remainder of the evaluation will be completed by another provider, this initial triage assessment does not replace that evaluation, and the importance of remaining in the ED until their evaluation is complete.    Francis Ileana SAILOR, PA-C 08/15/23 [phone]

## 2023-08-15 NOTE — ED Triage Notes (Signed)
 Pt states that he had concussion several months ago and feels as though it has been causing HTN since then. Pt c/o intermittent headache. Pt denies vision changes.

## 2023-08-26 ENCOUNTER — Ambulatory Visit: Payer: Medicaid Other | Admitting: Neurology

## 2023-08-31 ENCOUNTER — Encounter: Payer: Self-pay | Admitting: Neurology

## 2023-08-31 ENCOUNTER — Ambulatory Visit (INDEPENDENT_AMBULATORY_CARE_PROVIDER_SITE_OTHER): Payer: Medicaid Other | Admitting: Neurology

## 2023-08-31 VITALS — Ht 69.0 in | Wt 178.0 lb

## 2023-08-31 DIAGNOSIS — R0683 Snoring: Secondary | ICD-10-CM

## 2023-08-31 DIAGNOSIS — H04203 Unspecified epiphora, bilateral lacrimal glands: Secondary | ICD-10-CM

## 2023-08-31 DIAGNOSIS — R5383 Other fatigue: Secondary | ICD-10-CM

## 2023-08-31 DIAGNOSIS — G479 Sleep disorder, unspecified: Secondary | ICD-10-CM | POA: Diagnosis not present

## 2023-08-31 DIAGNOSIS — R531 Weakness: Secondary | ICD-10-CM | POA: Diagnosis not present

## 2023-08-31 DIAGNOSIS — R454 Irritability and anger: Secondary | ICD-10-CM

## 2023-08-31 DIAGNOSIS — Z9189 Other specified personal risk factors, not elsewhere classified: Secondary | ICD-10-CM

## 2023-08-31 NOTE — Patient Instructions (Addendum)
It was nice to meet you today.   As discussed, your headaches are likely due to a combination of factors.   Here is what we discussed today and my recommendations for you:   Your symptoms and constellation of concerns are not typical for concussion symptoms.  Nevertheless, I recommend we proceed with additional workup and I recommend that you follow-up with your PCP with regards to blood pressure management and a recheck.  I also recommend that you talk to your PCP about your mood irritability and the possibility of cutting back on the amitriptyline which can cause dry eyes and dry mouth as well as dizziness and tiredness.   Continue to limit your caffeine to 1-2 servings/day, as caffeine can drive headaches.  Continue to hydrate well with water. We will do a brain scan, called MRI and call you with the test results. We will have to schedule you for this on a separate date. This test requires authorization from your insurance, and we will take care of the insurance process. I will order a sleep study to look for signs of obstructive sleep apnea (aka OSA). As explained, the long-term risks and ramifications of untreated moderate to severe obstructive sleep apnea may include (but are not limited to): increased risk for cardiovascular disease, including congestive heart failure, stroke, difficult to control hypertension, treatment resistant obesity, arrhythmias, especially irregular heartbeat commonly known as A. Fib. (atrial fibrillation); even type 2 diabetes has been linked to untreated OSA.  I recommend that you establish with a new eye doctor of your choosing, any optometrist or ophthalmologist should be fine.  I recommend a full, dilated eye examination as you are complaining of watery eyes and you had complained about blurry vision intermittently.   We will plan a follow up after testing, depending on your test results.  We will keep you posted as to your brain MRI results and sleep study results by  phone call in the interim as well.

## 2023-08-31 NOTE — Progress Notes (Signed)
Subjective:    Patient ID: Timothy Reed is a 63 y.o. male.  HPI    Timothy Reed, Timothy Reed, Timothy Reed Dear Timothy Reed,  I saw your patient, Timothy Reed, upon your kind request in my neurologic clinic today for initial consultation of his dizziness and blurry vision, forgetfulness and fatigue, concern for postconcussive symptoms.  The patient is accompanied by his oldest son, Timothy Reed, today.  He missed an appointment in his office with Dr. Vickey Huger on 08/23/2023.  As you know, Timothy Reed is a 63 year old male with an underlying medical history of hypertension, hyperlipidemia, BPH, acid reflux disease, and anxiety, who reports recurrent issues with watery eyes, feeling irritable, feeling weak all over, elevated blood pressure, feeling dizzy at times.  He has not fallen recently.  He reports that he hit his head against a car about 2 months ago.  He denies any loss of consciousness or immediate symptoms at the time, apparently did have a laceration but did not have looked at, did not require any repair for the laceration, received no stitches.  He denies any sudden onset one-sided weakness or numbness or tingling or droopy face or slurring of speech.  He does not sleep well.  He is on high-dose amitriptyline for the past 4 to 5 years.  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  He has been on it for about a week.  He has no recent blurry vision but has complained about blurry vision before.  He has not seen an eye doctor in over 50 years.  He tries to hydrate well with water, he drinks decaf drinks only, decaf coffee in the morning.  He snores, particularly when he is on his back.  He has never had a sleep study. I reviewed your office note from 08/18/2023.  He has had resistant hypertension and recurrent dizziness and has had multiple ER visits which I reviewed in his electronic chart.  He  presented to the emergency room on 08/15/2023 with photophobia, irritability fatigue and blurry vision.  He reported concussion about 8 weeks prior.  He did not stay in the emergency room for evaluation to be completed.  He presented to the emergency room at [facility] on 08/13/2023 but did not stay for evaluation to be completed.  He presented to the emergency room at [facility] on 08/13/2023 with hypertension.  He reported intermittent fatigue and dizziness.  I reviewed the emergency room records.  He had a head CT without contrast and he was treated symptomatically with carvedilol, lisinopril and hydrochlorothiazide.  He had a head CT without contrast on 08/13/2003 and I reviewed the results:  IMPRESSION: No acute intracranial abnormality. He had a cervical spine CT without contrast and head CT without contrast and is in hospital on 03/29/2023 and I reviewed the results:  IMPRESSION: 1. No acute intracranial abnormality. 2. No acute fracture or subluxation of the cervical spine.   In addition, I personally and independently reviewed images through the PACS system.  Chronic degenerative changes were seen in the neck, reversal of cervical lordosis was seen as well.  He presented to the emergency room at [facility] on 08/12/2023 but did not stay for evaluation to be completed.  He presented to [facility] emergency department on 08/06/2023 with a chief complaint of eye problem.  I reviewed the emergency room records.  He was felt to have allergic conjunctivitis and allergic  rhinitis.  He did not appear to have postconcussion syndrome related symptoms.  He was advised to use an antihistamine.  Of note, he is currently on amitriptyline, fairly high dose at 150 mg at bedtime.  He is also on carvedilol, Zestoretic, and Flomax as well as Mevacor.  He is married and lives with his wife.  He has 4 children from his previous marriage and she has 2 children.  His Past Medical History Is  Significant For: Past Medical History:  Diagnosis Date   Acid reflux    Anxiety    HLD (hyperlipidemia)    Hypertension     His Past Surgical History Is Significant For: History reviewed. No pertinent surgical history.  His Family History Is Significant For: History reviewed. No pertinent family history.  His Social History Is Significant For: Social History   Socioeconomic History   Marital status: Married    Spouse name: Not on file   Number of children: Not on file   Years of education: Not on file   Highest education level: Not on file  Occupational History   Not on file  Tobacco Use   Smoking status: Former    Types: Cigarettes   Smokeless tobacco: Never  Vaping Use   Vaping status: Never Used  Substance and Sexual Activity   Alcohol use: No   Drug use: No    Comment: xanax   Sexual activity: Not on file  Other Topics Concern   Not on file  Social History Narrative   Not on file   Social Drivers of Health   Financial Resource Strain: Not on file  Food Insecurity: Not on file  Transportation Needs: Not on file  Physical Activity: Not on file  Stress: Not on file  Social Connections: Not on file    His Allergies Are:  No Known Allergies:    Current Medications Are:  Outpatient Encounter Medications as of 08/31/2023  Medication Sig   amitriptyline (ELAVIL) 150 MG tablet Take 150 mg by mouth at bedtime as needed.   carvedilol (COREG) 6.25 MG tablet Take 6.25 mg by mouth 2 (two) times daily.   fluticasone (FLONASE) 50 MCG/ACT nasal spray Place 1 spray into both nostrils daily. (Patient taking differently: Place 1 spray into both nostrils daily as needed for allergies or rhinitis.)   lisinopril-hydrochlorothiazide (ZESTORETIC) 20-25 MG tablet Take 1 tablet by mouth daily.   lovastatin (MEVACOR) 20 MG tablet Take 20 mg by mouth at bedtime.   tamsulosin (FLOMAX) 0.4 MG CAPS capsule Take 0.4 mg by mouth daily.   No facility-administered encounter  medications on file as of 08/31/2023.  :   Review of Systems:  Out of a complete 14 point review of systems, all are reviewed and negative with the exception of these symptoms as listed below:  Review of Systems  Neurological:        Patient in room #4 with his son. Patient states lately light and watching TV cause him to have a bad headaches. Patient states he headaches has gotten worse. Patient states he been a little more irration lately and feel he has forgotten thing like feeding the dog.     Objective:  Neurological Exam  Physical Exam Physical Examination:   Orthostatic vital signs do not show any orthostatic hypotension.  Blood pressure is elevated mildly with a lying blood pressure of 145/86 and a pulse of 91, standing 124/82 with a pulse of 102.  He denies any orthostatic dizziness or vertiginous symptoms.  General  Examination: The patient is a very pleasant 63 y.o. male in no acute distress. He appears well-developed and well-nourished and adequately groomed.   HEENT: Normocephalic, atraumatic, pupils are equal, round and reactive to light, no photophobia.  Funduscopic exam benign but bilateral cataracts noted.  Extraocular tracking is good without limitation to gaze excursion or nystagmus noted. Hearing is grossly intact. Face is symmetric with normal facial animation. Speech is clear with no dysarthria noted. There is no hypophonia. There is no lip, neck/head, jaw or voice tremor. Neck is supple with full range of passive and active motion. There are no carotid bruits on auscultation. Oropharynx exam reveals: Moderate mouth dryness, nearly edentulous, moderate airway crowding.  Tongue protrudes centrally and palate elevates symmetrically, tip of uvula and tonsils not fully visualized, Mallampati class IV.  Neck circumference 15 and three-quarter inches.   Chest: Clear to auscultation without wheezing, rhonchi or crackles noted.  Heart: S1+S2+0, regular and normal without  murmurs, rubs or gallops noted.   Abdomen: Soft, non-tender and non-distended.  Extremities: There is no pitting edema in the distal lower extremities bilaterally.   Skin: Warm and dry without trophic changes noted.   Musculoskeletal: exam reveals no obvious joint deformities.   Neurologically:  Mental status: The patient is awake, alert and oriented in all 4 spheres. His immediate and remote memory, attention, language skills and fund of knowledge are appropriate. There is no evidence of aphasia, agnosia, apraxia or anomia. Speech is clear with normal prosody and enunciation. Thought process is linear. Mood is normal and affect is normal.  Cranial nerves II - XII are as described above under HEENT exam.  Motor exam: Normal bulk, strength and tone is noted. There is no obvious action or resting tremor.  No drift or rebound. Romberg negative. Reflexes 1+ throughout, toes are downgoing bilaterally. Fine motor skills and coordination: Intact finger taps, hand movements and rapid alternating patting with both upper extremities, intact foot taps bilaterally in the lower extremities.    Cerebellar testing: No dysmetria or intention tremor. There is no truncal or gait ataxia.  Normal finger-to-nose, normal heel-to-shin. Sensory exam: intact to light touch in the upper and lower extremities.  Gait, station and balance: He stands without difficulty and posture is age-appropriate.  He walks with preserved arm swing.  Normal tandem walk after initial challenge.    Assessment and Plan:    In summary, Timothy Reed is a very pleasant 63 y.o.-year old male with an underlying medical history of hypertension, hyperlipidemia, BPH, acid reflux disease, and anxiety, who presents for evaluation of several symptoms including feeling weak, dizzy, intermittent blurry vision, watery eyes, sleep disturbance.  History and constellation of presentation is not compelling for postconcussive symptoms.  Sleep  disturbance, and suboptimally controlled hypertension and underlying mood disorder may be contributors to his symptoms.  His neurological exam is nonfocal and he is reassured.  I had a long discussion with the patient and his son today.  This was an extended visit of over 60 minutes with copious record review involved particularly electronic record review and ER visits, as well as considerable counseling and coordination of care.  Below is a summary of my discussion points with the patient and his son as well as my recommendations for him today.  They were given these instructions in writing and they will also sign him up for MyChart electronically:   <<  Your symptoms and constellation of concerns are not typical for concussion symptoms.  Nevertheless, I recommend we  proceed with additional workup and I recommend that you follow-up with your PCP with regards to blood pressure management and a recheck.  I also recommend that you talk to your PCP about your mood irritability and the possibility of cutting back on the amitriptyline which can cause dry eyes and dry mouth as well as dizziness and tiredness.   Continue to limit your caffeine to 1-2 servings/day, as caffeine can drive headaches.  Continue to hydrate well with water. We will do a brain scan, called MRI and call you with the test results. We will have to schedule you for this on a separate date. This test requires authorization from your insurance, and we will take care of the insurance process. I will order a sleep study to look for signs of obstructive sleep apnea (aka OSA). As explained, the long-term risks and ramifications of untreated moderate to severe obstructive sleep apnea may include (but are not limited to): increased risk for cardiovascular disease, including congestive heart failure, stroke, difficult to control hypertension, treatment resistant obesity, arrhythmias, especially irregular heartbeat commonly known as A. Fib. (atrial  fibrillation); even type 2 diabetes has been linked to untreated OSA.  I recommend that you establish with a new eye doctor of your choosing, any optometrist or ophthalmologist should be fine.  I recommend a full, dilated eye examination as you are complaining of watery eyes and you had complained about blurry vision intermittently.   We will plan a follow up after testing, depending on your test results.  We will keep you posted as to your brain MRI results and sleep study results by phone call in the interim as well.  >>  Thank you very much for allowing me to participate in the care of this nice patient. If I can be of any further assistance to you please do not hesitate to call me at [phone].  Sincerely,   Timothy Reed, Timothy Reed, Timothy Reed

## 2023-09-01 ENCOUNTER — Telehealth: Payer: Self-pay | Admitting: Neurology

## 2023-09-01 NOTE — Telephone Encounter (Signed)
[employer] pending

## 2023-09-02 NOTE — Telephone Encounter (Signed)
[employer] no auth req via fax

## 2023-09-02 NOTE — Telephone Encounter (Signed)
I spoke with the patient to schedule his SS but he did not want to schedule because he stated he is sleeping better now than he has ever had.

## 2023-09-06 ENCOUNTER — Telehealth: Payer: Self-pay | Admitting: Neurology

## 2023-09-06 NOTE — Telephone Encounter (Signed)
[employer] Berkley Harvey: [id number] exp. 09/06/23-10/06/23 sent to GI [phone]

## 2023-09-22 ENCOUNTER — Encounter: Payer: Self-pay | Admitting: Neurology

## 2023-09-27 ENCOUNTER — Ambulatory Visit
Admission: RE | Admit: 2023-09-27 | Discharge: 2023-09-27 | Disposition: A | Discharge status: Home / Self Care | Payer: Medicaid Other | Source: Ambulatory Visit | Attending: Neurology | Admitting: Neurology

## 2023-09-27 DIAGNOSIS — R5383 Other fatigue: Secondary | ICD-10-CM

## 2023-09-27 DIAGNOSIS — R531 Weakness: Secondary | ICD-10-CM

## 2023-09-27 DIAGNOSIS — R454 Irritability and anger: Secondary | ICD-10-CM

## 2023-09-27 DIAGNOSIS — Z9189 Other specified personal risk factors, not elsewhere classified: Secondary | ICD-10-CM

## 2023-09-27 DIAGNOSIS — R0683 Snoring: Secondary | ICD-10-CM

## 2023-09-27 DIAGNOSIS — H04203 Unspecified epiphora, bilateral lacrimal glands: Secondary | ICD-10-CM

## 2023-09-27 DIAGNOSIS — G479 Sleep disorder, unspecified: Secondary | ICD-10-CM

## 2023-09-27 MED ORDER — GADOPICLENOL 0.5 MMOL/ML IV SOLN
8.0000 mL | Freq: Once | INTRAVENOUS | Status: AC | PRN
Start: 2023-09-27 — End: 2023-09-27
  Administered 2023-09-27: 8 mL via INTRAVENOUS

## 2023-09-29 ENCOUNTER — Telehealth: Payer: Self-pay

## 2023-09-29 NOTE — Telephone Encounter (Signed)
Called pt and LVM to give us a call back

## 2023-09-29 NOTE — Telephone Encounter (Signed)
-----   Message from Huston Foley sent at 09/28/2023  7:24 AM EST ----- Please call patient and advise him that his recent brain MRI did not show any acute changes but chronic changes of sinus infection.  I recommend that he follow-up with his PCP at this point.  He may benefit from treatment of a sinus infection, especially if he has symptoms of head pressure, drainage, nasal congestion.  He can follow-up with me as needed.  If he changes his mind regarding his sleep study, he is welcome to call our office back.

## 2023-09-30 ENCOUNTER — Telehealth: Payer: Self-pay | Admitting: Neurology

## 2023-09-30 NOTE — Telephone Encounter (Signed)
Pt called stating that since the MRI that the provider ordered showed a Sinus Infection that we would have to put in a Rx for that because his PCP will not do it. Please advise.

## 2023-09-30 NOTE — Telephone Encounter (Signed)
Called pt and couldn't leave a message.

## 2023-09-30 NOTE — Telephone Encounter (Signed)
If patient has symptoms of sinus infection he can go to ER or Urgent care. His MRI suggests a chronic issues with sinus infection, not acute. He can also ask PCP to refer him to ENT. He was Dx with the Flu on 09/27/23 and was Rx Tamiflu 3 days ago and may start feeling better soon.

## 2023-10-04 NOTE — Telephone Encounter (Addendum)
 Tried to call pt but received message that VM hasn't been setup yet.

## 2023-10-04 NOTE — Telephone Encounter (Signed)
 I spoke with the patient and relayed message from Dr Frances Furbish. Pt states he went to urgent care today and was prescribed an antibiotic. I asked him to follow-up with PCP if he doesn't improve. He could request ENT referral from PCP as well if he would like. He thanked me for the call.

## 2023-10-04 NOTE — Telephone Encounter (Addendum)
 Tried to call pt again but received message that VM hasn't been setup yet.

## 2023-11-03 ENCOUNTER — Emergency Department (HOSPITAL_COMMUNITY)

## 2023-11-03 ENCOUNTER — Emergency Department (HOSPITAL_COMMUNITY)
Admission: EM | Admit: 2023-11-03 | Discharge: 2023-11-03 | Disposition: A | Discharge status: Home / Self Care | Attending: Emergency Medicine | Admitting: Emergency Medicine

## 2023-11-03 ENCOUNTER — Other Ambulatory Visit: Payer: Self-pay

## 2023-11-03 ENCOUNTER — Encounter (HOSPITAL_COMMUNITY): Payer: Self-pay

## 2023-11-03 DIAGNOSIS — R519 Headache, unspecified: Secondary | ICD-10-CM | POA: Diagnosis present

## 2023-11-03 DIAGNOSIS — I10 Essential (primary) hypertension: Secondary | ICD-10-CM | POA: Diagnosis not present

## 2023-11-03 DIAGNOSIS — Z79899 Other long term (current) drug therapy: Secondary | ICD-10-CM | POA: Insufficient documentation

## 2023-11-03 LAB — CBC WITH DIFFERENTIAL/PLATELET
Abs Immature Granulocytes: 0.02 10*3/uL (ref 0.00–0.07)
Basophils Absolute: 0.1 10*3/uL (ref 0.0–0.1)
Basophils Relative: 1 %
Eosinophils Absolute: 0.1 10*3/uL (ref 0.0–0.5)
Eosinophils Relative: 1 %
HCT: 45.9 % (ref 39.0–52.0)
Hemoglobin: 15.7 g/dL (ref 13.0–17.0)
Immature Granulocytes: 0 %
Lymphocytes Relative: 24 %
Lymphs Abs: 1.6 10*3/uL (ref 0.7–4.0)
MCH: 30.3 pg (ref 26.0–34.0)
MCHC: 34.2 g/dL (ref 30.0–36.0)
MCV: 88.6 fL (ref 80.0–100.0)
Monocytes Absolute: 0.6 10*3/uL (ref 0.1–1.0)
Monocytes Relative: 9 %
Neutro Abs: 4.2 10*3/uL (ref 1.7–7.7)
Neutrophils Relative %: 65 %
Platelets: 246 10*3/uL (ref 150–400)
RBC: 5.18 MIL/uL (ref 4.22–5.81)
RDW: 12.8 % (ref 11.5–15.5)
WBC: 6.5 10*3/uL (ref 4.0–10.5)
nRBC: 0 % (ref 0.0–0.2)

## 2023-11-03 LAB — BASIC METABOLIC PANEL
Anion gap: 8 (ref 5–15)
BUN: 13 mg/dL (ref 8–23)
CO2: 27 mmol/L (ref 22–32)
Calcium: 9.5 mg/dL (ref 8.9–10.3)
Chloride: 101 mmol/L (ref 98–111)
Creatinine, Ser: 1.21 mg/dL (ref 0.61–1.24)
GFR, Estimated: >60 mL/min (ref >60)
Glucose, Bld: 111 mg/dL — ABNORMAL HIGH (ref 70–99)
Potassium: 3.8 mmol/L (ref 3.5–5.1)
Sodium: 136 mmol/L (ref 135–145)

## 2023-11-03 MED ORDER — METOCLOPRAMIDE HCL 10 MG PO TABS
10.0000 mg | ORAL_TABLET | Freq: Once | ORAL | Status: AC
  Administered 2023-11-03: 10 mg via ORAL
  Filled 2023-11-03: qty 1

## 2023-11-03 MED ORDER — DIPHENHYDRAMINE HCL 50 MG/ML IJ SOLN: 12.5000 mg | Freq: Once | INTRAMUSCULAR | Status: DC

## 2023-11-03 MED ORDER — DIPHENHYDRAMINE HCL 25 MG PO CAPS
25.0000 mg | ORAL_CAPSULE | Freq: Once | ORAL | Status: AC
  Administered 2023-11-03: 25 mg via ORAL
  Filled 2023-11-03: qty 1

## 2023-11-03 MED ORDER — ACETAMINOPHEN 500 MG PO TABS
1000.0000 mg | ORAL_TABLET | Freq: Once | ORAL | Status: AC
  Administered 2023-11-03: 1000 mg via ORAL
  Filled 2023-11-03: qty 2

## 2023-11-03 MED ORDER — METOCLOPRAMIDE HCL 5 MG/ML IJ SOLN: 10.0000 mg | Freq: Once | INTRAMUSCULAR | Status: DC

## 2023-11-03 NOTE — Discharge Instructions (Signed)
 Please follow-up with your primary care doctor.  Did recommend staying well-hydrated.  You can take naproxen or Tylenol if headache reoccurs.  Return to ER if you have any new or worsening symptoms.

## 2023-11-03 NOTE — ED Notes (Signed)
 Pt refused labs.

## 2023-11-03 NOTE — ED Provider Notes (Signed)
 [place] EMERGENCY DEPARTMENT AT [facility] Provider Note   CSN: [id number] Arrival date & time: 11/03/23  1338     History  Chief Complaint  Patient presents with   Headache    ROLLAN ROGER is a 63 y.o. male patient with past medical history of anxiety, hypertension, hyperlipidemia is presenting to emergency room with complaint of headache.  Patient reports this has been ongoing for approximately 1 month.  He has history of headaches but this feels different.  Reports that headache feels like a pressure in the back of his head.  He reports no injury trauma or fall.  He has no other associated symptoms.  He has been seen at urgent care twice for this and been prescribed butalbital-acetaminophen-caffeine that temporarily relieves his symptoms.  He denies any sensitivity to light or noise.  Denies any focal deficits, blurry vision change in vision. He is requesting CT scan.    Headache      Home Medications Prior to Admission medications   Medication Sig Start Date End Date Taking? Authorizing Provider  amitriptyline (ELAVIL) 150 MG tablet Take 150 mg by mouth at bedtime as needed. 07/11/23   [provider]  carvedilol (COREG) 6.25 MG tablet Take 6.25 mg by mouth 2 (two) times daily.    [provider]  fluticasone (FLONASE) 50 MCG/ACT nasal spray Place 1 spray into both nostrils daily. Patient taking differently: Place 1 spray into both nostrils daily as needed for allergies or rhinitis. 09/28/16   Roxy Horseman, PA-C  lisinopril-hydrochlorothiazide (ZESTORETIC) 20-25 MG tablet Take 1 tablet by mouth daily. 06/30/23   [provider]  lovastatin (MEVACOR) 20 MG tablet Take 20 mg by mouth at bedtime.    [provider]  tamsulosin (FLOMAX) 0.4 MG CAPS capsule Take 0.4 mg by mouth daily. 06/15/23   [provider]      Allergies    Patient has no known allergies.    Review of Systems   Review of Systems   Neurological:  Positive for headaches.    Physical Exam Updated Vital Signs BP (!) 150/95 (BP Location: Right Arm)   Pulse (!) 103   Temp 98.3 F (36.8 C)   Resp 18   Ht 5\' 9"  (1.753 m)   Wt 78.5 kg   SpO2 99%   BMI 25.55 kg/m  Physical Exam Vitals and nursing note reviewed.  Constitutional:      General: He is not in acute distress.    Appearance: He is not toxic-appearing.  HENT:     Head: Normocephalic and atraumatic.  Eyes:     General: No scleral icterus.    Conjunctiva/sclera: Conjunctivae normal.  Cardiovascular:     Rate and Rhythm: Normal rate and regular rhythm.     Pulses: Normal pulses.     Heart sounds: Normal heart sounds.  Pulmonary:     Effort: Pulmonary effort is normal. No respiratory distress.     Breath sounds: Normal breath sounds.  Abdominal:     General: Abdomen is flat. Bowel sounds are normal.     Palpations: Abdomen is soft.     Tenderness: There is no abdominal tenderness.  Skin:    General: Skin is warm and dry.     Findings: No lesion.  Neurological:     General: No focal deficit present.     Mental Status: He is alert and oriented to person, place, and time. Mental status is at baseline.     GCS: GCS  eye subscore is 4. GCS verbal subscore is 5. GCS motor subscore is 6.     Cranial Nerves: No cranial nerve deficit.     Sensory: No sensory deficit.     Motor: No weakness.     Coordination: Coordination normal.  Psychiatric:        Mood and Affect: Mood is not anxious or depressed.     Comments: A&O x4, no slurred speech. No focal deficits on exam. No nystagmus, pupils equal and reactive. No ataxia.      ED Results / Procedures / Treatments   Labs (all labs ordered are listed, but only abnormal results are displayed) Labs Reviewed  BASIC METABOLIC PANEL - Abnormal; Notable for the following components:      Result Value   Glucose, Bld 111 (*)    All other components within normal limits  CBC WITH DIFFERENTIAL/PLATELET     EKG None  Radiology CT Head Wo Contrast Result Date: 11/03/2023 CLINICAL DATA:  63 year old male with increasing headache, persistent symptoms for 3 days. EXAM: CT HEAD WITHOUT CONTRAST TECHNIQUE: Contiguous axial images were obtained from the base of the skull through the vertex without intravenous contrast. RADIATION DOSE REDUCTION: This exam was performed according to the departmental dose-optimization program which includes automated exposure control, adjustment of the mA and/or kV according to patient size and/or use of iterative reconstruction technique. COMPARISON:  Brain MRI 09/27/2023.  Head CT 08/13/2023 and earlier. FINDINGS: Brain: Cerebral volume is stable and normal for age. No midline shift, ventriculomegaly, mass effect, evidence of mass lesion, intracranial hemorrhage or evidence of cortically based acute infarction. Gray-white matter differentiation is within normal limits throughout the brain. Vascular: No suspicious intracranial vascular hyperdensity. Calcified atherosclerosis at the skull base. Skull: Stable and intact.  No acute osseous abnormality identified. Sinuses/Orbits: Visualized paranasal sinuses and mastoids are stable and well aerated. Other: Visualized orbits and scalp soft tissues are within normal limits. IMPRESSION: Stable and normal for age noncontrast Head CT. Electronically Signed   By: Odessa Fleming M.D.   On: 11/03/2023 16:17    Procedures Procedures    Medications Ordered in ED Medications - No data to display  ED Course/ Medical Decision Making/ A&P Clinical Course as of 11/03/23 1719  Wed Nov 03, 2023  1603 Refusing IV HA cocktail, will given PO medications.  [JB]    Clinical Course User Index [JB] Evalin Shawhan, Horald Chestnut, PA-C                                 Medical Decision Making Risk OTC drugs. Prescription drug management.   Nkosi R Biscardi 63 y.o. presented today for HA. Working Ddx: tension headache, migraine, intracranial mass,  intracranial hemorrhage, intracranial infection including meningitis vs encephalitis, trigeminal neuralgia, AVM, sinusitis, cerebral aneurysm, muscular headache, cavernous sinus thrombosis, carotid artery dissection.  R/o DDx: intracranial mass, hemorrhage,or infection including meningitis vs encephalitis, trigeminal neuralgia, AVM, cerebral aneurysm, muscular headache, cavernous sinus thrombosis, carotid artery dissection are less likely due to history of present illness, physical exam, labs/imaging findings  PMHX: HTN, HLD  Unique Tests and My Interpretation:  CBC: wnl BMP: wnl  CT Head w/o Contrast: negative   Problem List / ED Course / Critical interventions / Medication management  Reporting with headache.  He is alert oriented answering questions appropriately no slurred speech.  He has no focal neurological deficits.  No blurry vision change in vision.  On exam he has  no change in peripheral vision.  No nystagmus pupils are equal and reactive.  Extremity strength and sensation equal bilaterally.  Lower extremity strength is equal bilaterally.  He is not having any associated symptoms.  He is able to ambulate and steady gait.  Reports this has been ongoing for quite some time now.  Will check basic labs give migraine cocktail and obtain head CT to rule out any acute intercranial abnormality and reassess. Patient stable throughout stay.  Reports headache improved after CT scan.  CT scan without any acute abnormality.  Lab work overall reassuring.  Patient reports his symptoms have significantly improved after receiving migraine cocktail.  Feel this may stable for discharge and follow-up with primary care patient agrees and understands plan I ordered medication including migraine cocktail Reevaluation of the patient after these medicines showed that the patient improved Patients vitals assessed. Upon arrival patient is hemodynamically stable.  I have reviewed the patients home medicines and  have made adjustments as needed    Plan:   F/u w/ PCP in 2-3d to ensure resolution of sx.  Patient was given return precautions. Patient stable for discharge at this time.  Patient educated on sx/dx and verbalized understanding of plan. Return to ED if new or worsening sx.           Final Clinical Impression(s) / ED Diagnoses Final diagnoses:  Bad headache    Rx / DC Orders ED Discharge Orders     None         Reinaldo Raddle 11/03/23 2237    Jacalyn Lefevre, MD 11/04/23 [phone]

## 2023-11-03 NOTE — ED Triage Notes (Signed)
 Pt states feels pressure in the back of his head that has travel to the top of his. Pt says it has been going on for three days.   Pt was seen at the hospital in [place] last week. Pt was given butalbital-acetaminophen-caffeine that was working.   The pt feel he needs something stronger d/t medication not working.   Pt denies blurred vision, weakness, dizziness.  Pt states he may need a scan.

## 2023-11-19 ENCOUNTER — Other Ambulatory Visit: Payer: Self-pay

## 2023-11-19 ENCOUNTER — Encounter (HOSPITAL_COMMUNITY): Payer: Self-pay | Admitting: Emergency Medicine

## 2023-11-19 ENCOUNTER — Emergency Department (HOSPITAL_COMMUNITY)
Admission: EM | Admit: 2023-11-19 | Discharge: 2023-11-19 | Disposition: A | Discharge status: Home / Self Care | Attending: Emergency Medicine | Admitting: Emergency Medicine

## 2023-11-19 DIAGNOSIS — F0781 Postconcussional syndrome: Secondary | ICD-10-CM | POA: Diagnosis not present

## 2023-11-19 DIAGNOSIS — R519 Headache, unspecified: Secondary | ICD-10-CM | POA: Insufficient documentation

## 2023-11-19 LAB — CBC WITH DIFFERENTIAL/PLATELET
Abs Immature Granulocytes: 0.01 10*3/uL (ref 0.00–0.07)
Basophils Absolute: 0.1 10*3/uL (ref 0.0–0.1)
Basophils Relative: 1 %
Eosinophils Absolute: 0.1 10*3/uL (ref 0.0–0.5)
Eosinophils Relative: 1 %
HCT: 43.1 % (ref 39.0–52.0)
Hemoglobin: 14.8 g/dL (ref 13.0–17.0)
Immature Granulocytes: 0 %
Lymphocytes Relative: 30 %
Lymphs Abs: 1.7 10*3/uL (ref 0.7–4.0)
MCH: 30.8 pg (ref 26.0–34.0)
MCHC: 34.3 g/dL (ref 30.0–36.0)
MCV: 89.6 fL (ref 80.0–100.0)
Monocytes Absolute: 0.5 10*3/uL (ref 0.1–1.0)
Monocytes Relative: 9 %
Neutro Abs: 3.2 10*3/uL (ref 1.7–7.7)
Neutrophils Relative %: 59 %
Platelets: 238 10*3/uL (ref 150–400)
RBC: 4.81 MIL/uL (ref 4.22–5.81)
RDW: 12.9 % (ref 11.5–15.5)
WBC: 5.5 10*3/uL (ref 4.0–10.5)
nRBC: 0 % (ref 0.0–0.2)

## 2023-11-19 LAB — COMPREHENSIVE METABOLIC PANEL WITH GFR
ALT: 29 U/L (ref 0–44)
AST: 21 U/L (ref 15–41)
Albumin: 4.2 g/dL (ref 3.5–5.0)
Alkaline Phosphatase: 58 U/L (ref 38–126)
Anion gap: 11 (ref 5–15)
BUN: 20 mg/dL (ref 8–23)
CO2: 27 mmol/L (ref 22–32)
Calcium: 9.3 mg/dL (ref 8.9–10.3)
Chloride: 97 mmol/L — ABNORMAL LOW (ref 98–111)
Creatinine, Ser: 1.1 mg/dL (ref 0.61–1.24)
GFR, Estimated: >60 mL/min (ref >60)
Glucose, Bld: 119 mg/dL — ABNORMAL HIGH (ref 70–99)
Potassium: 4.1 mmol/L (ref 3.5–5.1)
Sodium: 135 mmol/L (ref 135–145)
Total Bilirubin: 0.6 mg/dL (ref 0.0–1.2)
Total Protein: 7.4 g/dL (ref 6.5–8.1)

## 2023-11-19 LAB — SEDIMENTATION RATE: Sed Rate: 2 mm/h (ref 0–16)

## 2023-11-19 MED ORDER — MECLIZINE HCL 25 MG PO TABS: 25.0000 mg | ORAL_TABLET | Freq: Three times a day (TID) | ORAL | 0 refills | Status: AC | PRN

## 2023-11-19 NOTE — ED Provider Notes (Signed)
 [place] EMERGENCY DEPARTMENT AT [facility] Provider Note   CSN: [id number] Arrival date & time: 11/19/23  1500     History  Chief Complaint  Patient presents with   Headache    Timothy Reed is a 63 y.o. male.  Patient has a history of a previous head injury and has continued headaches and dizziness.  He was seen by neurology and they did an MRI in his head which showed possible sinus problems.  Patient refused to follow-up with ENT  The history is provided by the patient and medical records. No language interpreter was used.  Headache Pain location:  Generalized Quality:  Dull Radiates to:  Does not radiate Severity currently:  2/10 Severity at highest:  4/10 Onset quality:  Gradual Timing:  Intermittent Progression:  Waxing and waning Chronicity:  Recurrent Context: not activity   Associated symptoms: no abdominal pain, no back pain, no congestion, no cough, no diarrhea, no fatigue, no seizures and no sinus pressure        Home Medications Prior to Admission medications   Medication Sig Start Date End Date Taking? Authorizing Provider  meclizine (ANTIVERT) 25 MG tablet Take 1 tablet (25 mg total) by mouth 3 (three) times daily as needed for dizziness. 11/19/23  Yes Rettie Laird, MD  amitriptyline (ELAVIL) 150 MG tablet Take 150 mg by mouth at bedtime as needed. 07/11/23   [provider]  carvedilol (COREG) 6.25 MG tablet Take 6.25 mg by mouth 2 (two) times daily.    [provider]  fluticasone (FLONASE) 50 MCG/ACT nasal spray Place 1 spray into both nostrils daily. Patient taking differently: Place 1 spray into both nostrils daily as needed for allergies or rhinitis. 09/28/16   Sherel Dikes, PA-C  lisinopril-hydrochlorothiazide (ZESTORETIC) 20-25 MG tablet Take 1 tablet by mouth daily. 06/30/23   [provider]  lovastatin (MEVACOR) 20 MG tablet Take 20 mg by mouth at bedtime.    [provider]  tamsulosin  (FLOMAX) 0.4 MG CAPS capsule Take 0.4 mg by mouth daily. 06/15/23   [provider]      Allergies    Patient has no known allergies.    Review of Systems   Review of Systems  Constitutional:  Negative for appetite change and fatigue.  HENT:  Negative for congestion, ear discharge and sinus pressure.   Eyes:  Negative for discharge.  Respiratory:  Negative for cough.   Cardiovascular:  Negative for chest pain.  Gastrointestinal:  Negative for abdominal pain and diarrhea.  Genitourinary:  Negative for frequency and hematuria.  Musculoskeletal:  Negative for back pain.  Skin:  Negative for rash.  Neurological:  Positive for headaches. Negative for seizures.       Occasional memory loss  Psychiatric/Behavioral:  Negative for hallucinations.     Physical Exam Updated Vital Signs BP (!) 146/87   Pulse 100   Temp 98.7 F (37.1 C) (Oral)   Resp 20   Ht 5\' 9"  (1.753 m)   Wt 78.5 kg   SpO2 98%   BMI 25.55 kg/m  Physical Exam Vitals and nursing note reviewed.  Constitutional:      Appearance: He is well-developed.  HENT:     Head: Normocephalic.     Nose: Nose normal.  Eyes:     General: No scleral icterus.    Conjunctiva/sclera: Conjunctivae normal.  Neck:     Thyroid: No thyromegaly.  Cardiovascular:     Rate and Rhythm: Normal rate and regular rhythm.  Heart sounds: No murmur heard.    No friction rub. No gallop.  Pulmonary:     Breath sounds: No stridor. No wheezing or rales.  Chest:     Chest wall: No tenderness.  Abdominal:     General: There is no distension.     Tenderness: There is no abdominal tenderness. There is no rebound.  Musculoskeletal:        General: Normal range of motion.     Cervical back: Neck supple.  Lymphadenopathy:     Cervical: No cervical adenopathy.  Skin:    Findings: No erythema or rash.  Neurological:     Mental Status: He is alert and oriented to person, place, and time.     Motor: No abnormal muscle tone.      Coordination: Coordination normal.  Psychiatric:        Behavior: Behavior normal.     ED Results / Procedures / Treatments   Labs (all labs ordered are listed, but only abnormal results are displayed) Labs Reviewed  COMPREHENSIVE METABOLIC PANEL WITH GFR - Abnormal; Notable for the following components:      Result Value   Chloride 97 (*)    Glucose, Bld 119 (*)    All other components within normal limits  CBC WITH DIFFERENTIAL/PLATELET  SEDIMENTATION RATE    EKG None  Radiology No results found.  Procedures Procedures    Medications Ordered in ED Medications - No data to display  ED Course/ Medical Decision Making/ A&P                                 Medical Decision Making Amount and/or Complexity of Data Reviewed Labs: ordered.   Postconcussion syndrome.  Patient complains of dizziness and is given Antivert and he will follow back up with neurology        Final Clinical Impression(s) / ED Diagnoses Final diagnoses:  Post concussion syndrome    Rx / DC Orders ED Discharge Orders          Ordered    meclizine (ANTIVERT) 25 MG tablet  3 times daily PRN        11/19/23 1758              Lindberg Zenon, MD 11/20/23 1118

## 2023-11-19 NOTE — ED Notes (Signed)
 ED Provider at bedside.

## 2023-11-19 NOTE — ED Triage Notes (Signed)
 Pt to ER with continued c/o pressure to back of his head, weakness and not feeling well.  Pt continues to endorse to RN that he is having "concussion" symptoms.  States head injury in late February.  Denies new injury.  Has been seen several times for these symptoms.

## 2023-11-19 NOTE — Discharge Instructions (Signed)
 Follow back up with either your family doctor or your neurologist if not improving

## 2023-11-19 NOTE — ED Notes (Signed)
 EDP made aware of pt's BP 166/104

## 2023-11-26 ENCOUNTER — Encounter (HOSPITAL_COMMUNITY): Payer: Self-pay

## 2023-11-26 ENCOUNTER — Other Ambulatory Visit: Payer: Self-pay

## 2023-11-26 ENCOUNTER — Emergency Department (HOSPITAL_COMMUNITY)
Admission: EM | Admit: 2023-11-26 | Discharge: 2023-11-26 | Disposition: A | Discharge status: Home / Self Care | Attending: Emergency Medicine | Admitting: Emergency Medicine

## 2023-11-26 DIAGNOSIS — I10 Essential (primary) hypertension: Secondary | ICD-10-CM | POA: Diagnosis not present

## 2023-11-26 DIAGNOSIS — R519 Headache, unspecified: Secondary | ICD-10-CM | POA: Insufficient documentation

## 2023-11-26 DIAGNOSIS — Z79899 Other long term (current) drug therapy: Secondary | ICD-10-CM | POA: Diagnosis not present

## 2023-11-26 DIAGNOSIS — Z87891 Personal history of nicotine dependence: Secondary | ICD-10-CM | POA: Diagnosis not present

## 2023-11-26 MED ORDER — SODIUM CHLORIDE 0.9 % IV BOLUS: 1000.0000 mL | Freq: Once | INTRAVENOUS | Status: DC

## 2023-11-26 MED ORDER — PROCHLORPERAZINE MALEATE 5 MG PO TABS: 5.0000 mg | ORAL_TABLET | Freq: Two times a day (BID) | ORAL | 0 refills | Status: DC | PRN

## 2023-11-26 MED ORDER — PROCHLORPERAZINE EDISYLATE 10 MG/2ML IJ SOLN: 5.0000 mg | Freq: Once | INTRAMUSCULAR | Status: DC

## 2023-11-26 MED ORDER — DIPHENHYDRAMINE HCL 50 MG/ML IJ SOLN: 25.0000 mg | Freq: Once | INTRAMUSCULAR | Status: DC

## 2023-11-26 MED ORDER — LIDOCAINE 5 % EX PTCH: 1.0000 | MEDICATED_PATCH | Freq: Once | CUTANEOUS | Status: DC

## 2023-11-26 NOTE — ED Provider Notes (Signed)
 [facility] EMERGENCY DEPARTMENT AT [facility] Provider Note  CSN: [id number] Arrival date & time: 11/26/23 2004  Chief Complaint(s) Headache  HPI Timothy Reed is a 63 y.o. male with past medical history as below, significant for anxiety, hyperlipidemia, hypertension who presents to the ED with complaint of headache  He reports intermittent headache over the past 2 months after kicking a tire that he feels exacerbated he symptoms from prior concussion.  He was seen in the ER multiple times with same complaint, said multiple reassuring workups, he was started on different medications for his headaches, said initially helped with the headaches have returned.  Reports a tightness, squeezing sensation to his occiput, no nausea, vomiting, vision change, numbness or weakness to extremities.  No difficulty speaking.  No fevers or chills.  No repeat head injuries.  Saw [facility] previously  Past Medical History Past Medical History:  Diagnosis Date   Acid reflux    Anxiety    HLD (hyperlipidemia)    Hypertension    There are no active problems to display for this patient.  Home Medication(s) Prior to Admission medications   Medication Sig Start Date End Date Taking? Authorizing Provider  prochlorperazine  (COMPAZINE ) 5 MG tablet Take 1 tablet (5 mg total) by mouth 2 (two) times daily as needed (headache). 11/26/23  Yes Teddi Favors, DO  amitriptyline (ELAVIL) 150 MG tablet Take 150 mg by mouth at bedtime as needed. 07/11/23   [provider]  carvedilol  (COREG ) 6.25 MG tablet Take 6.25 mg by mouth 2 (two) times daily.    [provider]  fluticasone  (FLONASE ) 50 MCG/ACT nasal spray Place 1 spray into both nostrils daily. Patient taking differently: Place 1 spray into both nostrils daily as needed for allergies or rhinitis. 09/28/16   Sherel Dikes, PA-C  lisinopril -hydrochlorothiazide  (ZESTORETIC ) 20-25 MG tablet Take 1 tablet by mouth daily. 06/30/23    [provider]  lovastatin (MEVACOR) 20 MG tablet Take 20 mg by mouth at bedtime.    [provider]  meclizine  (ANTIVERT ) 25 MG tablet Take 1 tablet (25 mg total) by mouth 3 (three) times daily as needed for dizziness. 11/19/23   Cheyenne Cotta, MD  tamsulosin  (FLOMAX ) 0.4 MG CAPS capsule Take 0.4 mg by mouth daily. 06/15/23   [provider]                                                                                                                                    Past Surgical History History reviewed. No pertinent surgical history. Family History History reviewed. No pertinent family history.  Social History Social History   Tobacco Use   Smoking status: Former    Types: Cigarettes   Smokeless tobacco: Never  Vaping Use   Vaping status: Never Used  Substance Use Topics   Alcohol use: No   Drug use: No    Comment: xanax   Allergies Patient  has no known allergies.  Review of Systems A thorough review of systems was obtained and all systems are negative except as noted in the HPI and PMH.   Physical Exam Vital Signs  I have reviewed the triage vital signs BP (!) 133/94 (BP Location: Right Arm)   Pulse (!) 107   Temp 97.9 F (36.6 C)   Resp 16   Ht 5\' 9"  (1.753 m)   Wt 78.5 kg   SpO2 98%   BMI 25.55 kg/m  Physical Exam Vitals and nursing note reviewed.  Constitutional:      General: He is not in acute distress.    Appearance: He is well-developed.  HENT:     Head: Normocephalic and atraumatic.     Right Ear: External ear normal.     Left Ear: External ear normal.     Mouth/Throat:     Mouth: Mucous membranes are moist.  Eyes:     General: No scleral icterus.    Extraocular Movements: Extraocular movements intact.     Pupils: Pupils are equal, round, and reactive to light.  Cardiovascular:     Rate and Rhythm: Normal rate and regular rhythm.     Pulses: Normal pulses.     Heart sounds: Normal heart sounds.  Pulmonary:      Effort: Pulmonary effort is normal. No respiratory distress.     Breath sounds: Normal breath sounds.  Abdominal:     General: Abdomen is flat.     Palpations: Abdomen is soft.     Tenderness: There is no abdominal tenderness.  Musculoskeletal:     Cervical back: No rigidity.     Right lower leg: No edema.     Left lower leg: No edema.  Skin:    General: Skin is warm and dry.     Capillary Refill: Capillary refill takes less than 2 seconds.  Neurological:     Mental Status: He is alert and oriented to person, place, and time.     GCS: GCS eye subscore is 4. GCS verbal subscore is 5. GCS motor subscore is 6.     Cranial Nerves: Cranial nerves 2-12 are intact. No facial asymmetry.     Sensory: Sensation is intact.     Motor: Motor function is intact. No weakness or tremor.     Coordination: Coordination is intact. Finger-Nose-Finger Test normal.     Gait: Gait is intact.     Comments: Strength 5/5 to BLUE/BLLE, equal and symmetric    Psychiatric:        Mood and Affect: Mood normal.        Behavior: Behavior normal.     ED Results and Treatments Labs (all labs ordered are listed, but only abnormal results are displayed) Labs Reviewed  CBC WITH DIFFERENTIAL/PLATELET  BASIC METABOLIC PANEL WITH GFR  Radiology No results found.  Pertinent labs & imaging results that were available during my care of the patient were reviewed by me and considered in my medical decision making (see MDM for details).  Medications Ordered in ED Medications  lidocaine  (LIDODERM ) 5 % 1 patch (has no administration in time range)  prochlorperazine  (COMPAZINE ) injection 5 mg (has no administration in time range)  diphenhydrAMINE  (BENADRYL ) injection 25 mg (has no administration in time range)  sodium chloride  0.9 % bolus 1,000 mL (has no administration in time range)                                                                                                                                      Procedures Procedures  (including critical care time)  Medical Decision Making / ED Course    Medical Decision Making:    Timothy Reed is a 63 y.o. male with past medical history as below, significant for anxiety, hyperlipidemia, hypertension who presents to the ED with complaint of headache. The complaint involves an extensive differential diagnosis and also carries with it a high risk of complications and morbidity.  Serious etiology was considered. Ddx includes but is not limited to: Differential diagnosis includes but is not exclusive to subarachnoid hemorrhage, meningitis, encephalitis, previous head trauma, cavernous venous thrombosis, muscle tension headache, glaucoma, temporal arteritis, migraine or migraine equivalent, etc.   Complete initial physical exam performed, notably the patient was in no distress, neuro nonfocal.    Reviewed and confirmed nursing documentation for past medical history, family history, social history.  Vital signs reviewed.       Brief summary: 63 yo/m history as above here with headache.  Patient with intermittent headache over the past 2 months.  Symptoms are intermittent, typically worsen throughout the day.  His neuroexam is nonfocal.  Will collect screening labs, head CT, give analgesia for his headache.  MRI 2/17 stable CTH 3/26 stable  I was informed by nursing that soon as I left the room the patient eloped from the treatment area.  I sent a referral back to [facility]. I will also send some compazine  to his pharmacy.                Additional history obtained: -Additional history obtained from na -External records from outside source obtained and reviewed including: Chart review including previous notes, labs, imaging, consultation notes including  Prior imaging, prior ER visits, home  medications   Lab Tests: -I ordered labs.   The pertinent results include:   Labs Reviewed  CBC WITH DIFFERENTIAL/PLATELET  BASIC METABOLIC PANEL WITH GFR      EKG   EKG Interpretation Date/Time:    Ventricular Rate:    PR Interval:    QRS Duration:    QT Interval:    QTC Calculation:   R Axis:      Text Interpretation:  Imaging Studies ordered: I ordered imaging studies including CTH   Medicines ordered and prescription drug management: Meds ordered this encounter  Medications   lidocaine  (LIDODERM ) 5 % 1 patch   prochlorperazine  (COMPAZINE ) injection 5 mg   diphenhydrAMINE  (BENADRYL ) injection 25 mg   sodium chloride  0.9 % bolus 1,000 mL   prochlorperazine  (COMPAZINE ) 5 MG tablet    Sig: Take 1 tablet (5 mg total) by mouth 2 (two) times daily as needed (headache).    Dispense:  10 tablet    Refill:  0    -I have reviewed the patients home medicines and have made adjustments as needed   Consultations Obtained: na   Cardiac Monitoring: Continuous pulse oximetry interpreted by myself, 99% on RA.    Social Determinants of Health:  Diagnosis or treatment significantly limited by social determinants of health: former smoker   Reevaluation: After the interventions noted above, I reevaluated the patient and found that they have stayed the same  Co morbidities that complicate the patient evaluation  Past Medical History:  Diagnosis Date   Acid reflux    Anxiety    HLD (hyperlipidemia)    Hypertension       Dispostion: Disposition decision including need for hospitalization was considered, and patient Eloped    Final Clinical Impression(s) / ED Diagnoses Final diagnoses:  Bad headache        Teddi Favors, DO 11/26/23 2219

## 2023-11-26 NOTE — ED Triage Notes (Signed)
 Pt reports he got mad and kicked a tire several times in February and kicking the tire made his head hurt and he was told it gave him a concussion.  Pt reports he has had a headache and dizziness ever since and he has been given pain meds, muscle relaxer, and dizzy meds and it worked good initially but it keeps coming back.

## 2023-12-02 ENCOUNTER — Telehealth: Payer: Self-pay | Admitting: Neurology

## 2023-12-02 NOTE — Telephone Encounter (Signed)
 Pt called wanting to speak to the RN about a concussion he had right after he had his MRI done that was ordered from here. He would like to discuss.

## 2023-12-06 NOTE — Telephone Encounter (Signed)
 Pt called.  He is asking for a MRI to check and see if anything has changed relating to he states that he since last seen, has kicked his lawnmower tires (x 2) and notes head pressure, dizziness, not clear headed.  Had seen his pcp and placed on antibiotics for sinuses and finished and was better.  Since this last incident, he was seen in ED 12-02-2023 and had CT head done [facility].   Labs done 11-19-2023.  He said that he was triaged at health dept? and told to contact us  for order of MRI.  Pressure he is having is in back and middle of head.  I relayed that we saw him and did MRI previously and showed sinus issues and referred back to pcp/ treated and last CT head sinus clear. He has not had sleep study (cancelled).  I relayed will send message to Dr. Omar Bibber for recommendations appt ?

## 2023-12-06 NOTE — Telephone Encounter (Signed)
 I called pt and relayed verbatim the message per Dr. Omar Bibber.  "any new or acute concerns, I recommend that he go to the emergency room or get checked out by his PCP urgently. I recommend that he call his PCP today for an appointment." He said ok.

## 2023-12-06 NOTE — Telephone Encounter (Signed)
 If he has any new or acute concerns, I recommend that he go to the emergency room or get checked out by his PCP urgently.  I recommend that he call his PCP today for an appointment.

## 2023-12-08 ENCOUNTER — Ambulatory Visit: Payer: Medicaid Other | Admitting: Neurology

## 2023-12-13 NOTE — Telephone Encounter (Signed)
 Pt called requesting a call back regarding worsening symptoms. Pt stated that he continues with pressure in the back of the head that gets worse when he is driving, dizziness and feels fatigued all day. States he is not able to function nor is able to do his daily activities. Pt states he has seen his PCP and he was advised to follow up with his neurologist. Pt states he would like for the doctor to order an MRI since his symptoms are not getting any better only seem to get worse.

## 2023-12-13 NOTE — Telephone Encounter (Signed)
 Pt is scheduled 01-19-2024 for consult.  We may be able to move up if openings permit. (3 days )

## 2023-12-14 NOTE — Telephone Encounter (Signed)
 Pt has returned call to RN

## 2023-12-14 NOTE — Telephone Encounter (Signed)
 I called pt and relayed that we did get referral about worsening headaches due to new concussion.  I did not have anything to offer him at this time , but he is on the waitlist,  if he seems to get worse then he may need to seek care at ED like he has done previously.  He verbalized understanding.

## 2023-12-14 NOTE — Telephone Encounter (Signed)
 I called pt but could not LVM as not set up.

## 2023-12-20 ENCOUNTER — Encounter (HOSPITAL_COMMUNITY): Payer: Self-pay

## 2023-12-20 ENCOUNTER — Other Ambulatory Visit: Payer: Self-pay

## 2023-12-20 ENCOUNTER — Emergency Department (HOSPITAL_COMMUNITY)
Admission: EM | Admit: 2023-12-20 | Discharge: 2023-12-20 | Disposition: A | Discharge status: Home / Self Care | Attending: Emergency Medicine | Admitting: Emergency Medicine

## 2023-12-20 DIAGNOSIS — I10 Essential (primary) hypertension: Secondary | ICD-10-CM | POA: Diagnosis not present

## 2023-12-20 DIAGNOSIS — Z79899 Other long term (current) drug therapy: Secondary | ICD-10-CM | POA: Diagnosis not present

## 2023-12-20 DIAGNOSIS — R519 Headache, unspecified: Secondary | ICD-10-CM | POA: Diagnosis present

## 2023-12-20 MED ORDER — KETOROLAC TROMETHAMINE 30 MG/ML IJ SOLN
30.0000 mg | Freq: Once | INTRAMUSCULAR | Status: AC
  Administered 2023-12-20: 30 mg via INTRAVENOUS
  Filled 2023-12-20: qty 1

## 2023-12-20 MED ORDER — METOCLOPRAMIDE HCL 5 MG/ML IJ SOLN
10.0000 mg | Freq: Once | INTRAMUSCULAR | Status: AC
  Administered 2023-12-20: 10 mg via INTRAVENOUS
  Filled 2023-12-20: qty 2

## 2023-12-20 MED ORDER — PREDNISONE 10 MG PO TABS: ORAL_TABLET | ORAL | 0 refills | Status: AC

## 2023-12-20 MED ORDER — DEXAMETHASONE SODIUM PHOSPHATE 10 MG/ML IJ SOLN
10.0000 mg | Freq: Once | INTRAMUSCULAR | Status: AC
  Administered 2023-12-20: 10 mg via INTRAVENOUS
  Filled 2023-12-20: qty 1

## 2023-12-20 MED ORDER — PROCHLORPERAZINE MALEATE 5 MG PO TABS: 5.0000 mg | ORAL_TABLET | Freq: Two times a day (BID) | ORAL | 0 refills | Status: AC | PRN

## 2023-12-20 MED ORDER — KETOROLAC TROMETHAMINE 60 MG/2ML IM SOLN: 60.0000 mg | Freq: Once | INTRAMUSCULAR | Status: DC

## 2023-12-20 MED ORDER — DIPHENHYDRAMINE HCL 25 MG PO CAPS
25.0000 mg | ORAL_CAPSULE | Freq: Once | ORAL | Status: AC
  Administered 2023-12-20: 25 mg via ORAL
  Filled 2023-12-20: qty 1

## 2023-12-20 MED ORDER — METOCLOPRAMIDE HCL 5 MG/ML IJ SOLN: 10.0000 mg | Freq: Once | INTRAMUSCULAR | Status: DC

## 2023-12-20 NOTE — ED Triage Notes (Signed)
 Pt states he had head injury 2 months ago and now has no energy. Pt complains bright lights hurts his eyes and states he has pressure in his head. Pt states he has appointment for MRI of head 01-24-24.

## 2023-12-20 NOTE — ED Provider Notes (Signed)
 [name] Provider Note   CSN: [id number] Arrival date & time: 12/20/23  1126     History {Add pertinent medical, surgical, social history, OB history to HPI:1} Chief Complaint  Patient presents with  . Head Injury    Timothy Reed is a 63 y.o. male.   Head Injury Associated symptoms: headache and nausea   Associated symptoms: no neck pain, no numbness and no vomiting        Timothy Reed is a 63 y.o. male past medical history of hypertension, hyperlipidemia, anxiety who presents to the Emergency Department complaining of persistent "head pressure" x 2 months.  He has been seen multiple times here at another ER facility for similar symptoms.  Has a history of prior concussion was seen 3 weeks ago for same after kicking a tire that he feels exacerbated his symptoms.  Has been treated with antibiotics for sinus infection without significant relief.  He is also been prescribed naproxen from another ER facility without relief.  He states that he has a pressure sensation to the back of his head that sometimes moves to the front and to the sides.  His head pressure is associated with phonophobia and photophobia.  Denies any nausea, vomiting, facial or extremity weakness.  No numbness or dizziness.  Neck pain fever or chills.  He has also been seen by [facility] neurology, had MRI of the brain in February and has had recent head CTs as well.  Home Medications Prior to Admission medications   Medication Sig Start Date End Date Taking? Authorizing Provider  amitriptyline (ELAVIL) 150 MG tablet Take 150 mg by mouth at bedtime as needed. 07/11/23   [provider]  carvedilol  (COREG ) 6.25 MG tablet Take 6.25 mg by mouth 2 (two) times daily.    [provider]  fluticasone  (FLONASE ) 50 MCG/ACT nasal spray Place 1 spray into both nostrils daily. Patient taking differently: Place 1 spray into both nostrils daily as needed for  allergies or rhinitis. 09/28/16   Sherel Dikes, PA-C  lisinopril -hydrochlorothiazide  (ZESTORETIC ) 20-25 MG tablet Take 1 tablet by mouth daily. 06/30/23   [provider]  lovastatin (MEVACOR) 20 MG tablet Take 20 mg by mouth at bedtime.    [provider]  meclizine  (ANTIVERT ) 25 MG tablet Take 1 tablet (25 mg total) by mouth 3 (three) times daily as needed for dizziness. 11/19/23   Cheyenne Cotta, MD  prochlorperazine  (COMPAZINE ) 5 MG tablet Take 1 tablet (5 mg total) by mouth 2 (two) times daily as needed (headache). 11/26/23   Teddi Favors, DO  tamsulosin  (FLOMAX ) 0.4 MG CAPS capsule Take 0.4 mg by mouth daily. 06/15/23   [provider]      Allergies    Patient has no known allergies.    Review of Systems   Review of Systems  Constitutional:  Negative for appetite change, chills and fever.  HENT:  Negative for sneezing and sore throat.   Eyes:  Positive for photophobia. Negative for pain and visual disturbance.  Respiratory:  Negative for cough, chest tightness and shortness of breath.   Cardiovascular:  Negative for chest pain.  Gastrointestinal:  Positive for nausea. Negative for vomiting.  Musculoskeletal:  Negative for back pain, neck pain and neck stiffness.  Neurological:  Positive for headaches. Negative for dizziness, syncope, facial asymmetry, speech difficulty, weakness and numbness.    Physical Exam Updated Vital Signs BP (!) 145/97 (BP Location: Right Arm)   Pulse 93  Temp 98.7 F (37.1 C) (Oral)   Resp 16   Ht 5\' 9"  (1.753 m)   Wt 79.4 kg   SpO2 97%   BMI 25.84 kg/m  Physical Exam Vitals and nursing note reviewed.  Constitutional:      General: He is not in acute distress.    Appearance: Normal appearance. He is not ill-appearing or toxic-appearing.  HENT:     Right Ear: Tympanic membrane and ear canal normal.     Left Ear: Tympanic membrane and ear canal normal.     Mouth/Throat:     Mouth: Mucous membranes are moist.   Eyes:     Extraocular Movements: Extraocular movements intact.     Conjunctiva/sclera: Conjunctivae normal.     Pupils: Pupils are equal, round, and reactive to light.  Cardiovascular:     Rate and Rhythm: Normal rate and regular rhythm.     Pulses: Normal pulses.  Pulmonary:     Effort: Pulmonary effort is normal.     Breath sounds: Normal breath sounds.  Abdominal:     Palpations: Abdomen is soft.     Tenderness: There is no abdominal tenderness.  Musculoskeletal:        General: Normal range of motion.     Cervical back: Normal range of motion. No tenderness.  Lymphadenopathy:     Cervical: No cervical adenopathy.  Skin:    General: Skin is warm.     Capillary Refill: Capillary refill takes less than 2 seconds.  Neurological:     General: No focal deficit present.     Mental Status: He is alert.     GCS: GCS eye subscore is 4. GCS verbal subscore is 5. GCS motor subscore is 6.     Sensory: Sensation is intact. No sensory deficit.     Motor: Motor function is intact. No weakness, abnormal muscle tone or pronator drift.     Coordination: Coordination is intact. Coordination normal.     Comments: CN II-XII intact, speech clear.  Mentating well.  No facial droop    ED Results / Procedures / Treatments   Labs (all labs ordered are listed, but only abnormal results are displayed) Labs Reviewed - No data to display  EKG None  Radiology No results found.  Procedures Procedures  {Document cardiac monitor, telemetry assessment procedure when appropriate:1}  Medications Ordered in ED Medications  diphenhydrAMINE  (BENADRYL ) capsule 25 mg (has no administration in time range)  ketorolac (TORADOL) 30 MG/ML injection 30 mg (has no administration in time range)  metoCLOPramide  (REGLAN ) injection 10 mg (has no administration in time range)  dexamethasone (DECADRON) injection 10 mg (has no administration in time range)    ED Course/ Medical Decision Making/ A&P   {   Click  here for ABCD2, HEART and other calculatorsREFRESH Note before signing :1}                              Medical Decision Making Patient here with persistent headache for 2 months.  Initially reported concussion seen here 3 weeks ago after kicking a tire which exacerbated his symptoms.  Symptoms have not been relieved since then.  Has had reassuring head CTs a few weeks ago and had MRI of the brain in February has been followed by neurology as well.  No new symptoms or recent injury.  Patient is vague historian, no new symptoms or new injury.  He was recently treated with naproxen and  Compazine  without improvement.  He is also completed a course of antibiotics last month.  He has a reassuring neurologic exam here today.  Cause of his symptoms are unclear, I suspect possible sinusitis versus atypical migraine.  This may also be related to a postconcussive syndrome.  Subarachnoid hemorrhage, encephalitis, meningitis, temporal arteritis all considered as well but felt less likely.    Amount and/or Complexity of Data Reviewed Discussion of management or test interpretation with external provider(s): Patient has a nonfocal neuroexam today.  No new symptoms or new injuries.  Is concerned that he has a brain tumor or mass.  He had CT of the head on 12/01/2023 without evidence of intracranial mass.  Labs 3 weeks ago were reassuring.  He had also had a reassuring sed rate.    Will try migraine cocktail and reassess  On recheck, pt reports feeling better and head pressure has resolved.  Will prescribe steroid taper and he will keep his upcoming MRI and neurology f/u  Risk Prescription drug management.     {Document critical care time when appropriate:1} {Document review of labs and clinical decision tools ie heart score, Chads2Vasc2 etc:1}  {Document your independent review of radiology images, and any outside records:1} {Document your discussion with family members, caretakers, and with  consultants:1} {Document social determinants of health affecting pt's care:1} {Document your decision making why or why not admission, treatments were needed:1} Final Clinical Impression(s) / ED Diagnoses Final diagnoses:  None    Rx / DC Orders ED Discharge Orders     None

## 2023-12-20 NOTE — Discharge Instructions (Signed)
 Take the medication as directed.  Start the prednisone  prescription tomorrow.  The other prescription may help with your head pain as well but will likely cause drowsiness.  Avoid driving or operating machinery while taking this medication.  Follow-up with your primary care provider for recheck or return to the emergency department for any new or worsening symptoms.  I also recommend that you contact your neurologist for follow-up as well.

## 2023-12-21 ENCOUNTER — Telehealth: Payer: Self-pay | Admitting: Neurology

## 2023-12-21 NOTE — Telephone Encounter (Signed)
 Pt called  in regards to MRI scan . Pt states he is suppose to have another MRI but have not heard anything back. Was concern . Pt would like a callback [phone] )

## 2023-12-21 NOTE — Telephone Encounter (Signed)
 Will look for an earlier appt.

## 2023-12-21 NOTE — Telephone Encounter (Signed)
 I have repeatedly told pt that will be addressed at appt that he has in 01/2024.

## 2023-12-21 NOTE — Telephone Encounter (Signed)
 I called pt back.  He said he went to ED yesterday.  Was given 12-20-2023 prednisone  and compazine  which has helped him.  Still asking for MRI. I relayed that MD will decide if that is needed.   I relayed that will look for appt for him sooner then 01-24-2024.  He appreciated call back.

## 2023-12-21 NOTE — Telephone Encounter (Signed)
 Pt called back in regard to scheduling MRI . Informed Pt what was in notes . Pt refused to listen . I did state again what Raynelle Callow has noted.

## 2023-12-27 ENCOUNTER — Encounter (HOSPITAL_COMMUNITY): Payer: Self-pay

## 2023-12-27 ENCOUNTER — Other Ambulatory Visit: Payer: Self-pay

## 2023-12-27 ENCOUNTER — Emergency Department (HOSPITAL_COMMUNITY)
Admission: EM | Admit: 2023-12-27 | Discharge: 2023-12-27 | Disposition: A | Discharge status: Home / Self Care | Attending: Emergency Medicine | Admitting: Emergency Medicine

## 2023-12-27 DIAGNOSIS — I10 Essential (primary) hypertension: Secondary | ICD-10-CM | POA: Diagnosis not present

## 2023-12-27 DIAGNOSIS — R519 Headache, unspecified: Secondary | ICD-10-CM | POA: Insufficient documentation

## 2023-12-27 DIAGNOSIS — Z79899 Other long term (current) drug therapy: Secondary | ICD-10-CM | POA: Insufficient documentation

## 2023-12-27 MED ORDER — DIPHENHYDRAMINE HCL 50 MG/ML IJ SOLN
25.0000 mg | Freq: Once | INTRAMUSCULAR | Status: AC
  Administered 2023-12-27: 25 mg via INTRAVENOUS
  Filled 2023-12-27: qty 1

## 2023-12-27 MED ORDER — LORAZEPAM 1 MG PO TABS
1.0000 mg | ORAL_TABLET | Freq: Once | ORAL | Status: AC
  Administered 2023-12-27: 1 mg via ORAL
  Filled 2023-12-27: qty 1

## 2023-12-27 MED ORDER — KETOROLAC TROMETHAMINE 15 MG/ML IJ SOLN
15.0000 mg | Freq: Once | INTRAMUSCULAR | Status: AC
  Administered 2023-12-27: 15 mg via INTRAVENOUS
  Filled 2023-12-27: qty 1

## 2023-12-27 MED ORDER — METOCLOPRAMIDE HCL 5 MG/ML IJ SOLN
10.0000 mg | Freq: Once | INTRAMUSCULAR | Status: DC
  Filled 2023-12-27 (×2): qty 2

## 2023-12-27 MED ORDER — MECLIZINE HCL 25 MG PO TABS: 25.0000 mg | ORAL_TABLET | Freq: Three times a day (TID) | ORAL | 0 refills | Status: AC | PRN
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
## 2023-12-27 NOTE — ED Triage Notes (Signed)
 Patient c/o head pressure, worse over the past 3 days, after a concussion 2 months ago. Patient reports hx of concussions but they didn't last this long or hurt this bad, no pain medicine helps. A&Ox4, ambulatory.

## 2023-12-27 NOTE — Discharge Instructions (Addendum)
 Thank you for letting evaluate you today.  We improved your headache with a migraine cocktail.  Please call neurology and try to get your appointment sooner than June.  You may use Excedrin, Tylenol , ibuprofen as needed for headaches.  You may use your meclizine  up to 3 times a day as needed for dizziness.  Return to emergency department if you experience loss of consciousness, numbness or weakness in one-sided body, slurred speech, altered mentation, additional head injury, blurred or loss of vision

## 2024-01-11 ENCOUNTER — Telehealth: Payer: Self-pay | Admitting: Neurology

## 2024-01-11 NOTE — Telephone Encounter (Signed)
 Request to be added to wait list

## 2024-01-18 ENCOUNTER — Other Ambulatory Visit: Payer: Self-pay

## 2024-01-18 ENCOUNTER — Emergency Department (HOSPITAL_COMMUNITY)
Admission: EM | Admit: 2024-01-18 | Discharge: 2024-01-18 | Disposition: A | Discharge status: Home / Self Care | Attending: Emergency Medicine | Admitting: Emergency Medicine

## 2024-01-18 DIAGNOSIS — R519 Headache, unspecified: Secondary | ICD-10-CM | POA: Insufficient documentation

## 2024-01-18 MED ORDER — METOCLOPRAMIDE HCL 10 MG PO TABS: 10.0000 mg | ORAL_TABLET | Freq: Four times a day (QID) | ORAL | 0 refills | Status: AC

## 2024-01-18 MED ORDER — METOCLOPRAMIDE HCL 5 MG/ML IJ SOLN
10.0000 mg | Freq: Once | INTRAMUSCULAR | Status: AC
  Administered 2024-01-18: 10 mg via INTRAVENOUS
  Filled 2024-01-18: qty 2

## 2024-01-18 MED ORDER — DIPHENHYDRAMINE HCL 50 MG/ML IJ SOLN
12.5000 mg | Freq: Once | INTRAMUSCULAR | Status: AC
  Administered 2024-01-18: 12.5 mg via INTRAVENOUS
  Filled 2024-01-18: qty 1

## 2024-01-18 MED ORDER — DEXAMETHASONE SODIUM PHOSPHATE 10 MG/ML IJ SOLN
10.0000 mg | Freq: Once | INTRAMUSCULAR | Status: AC
  Administered 2024-01-18: 10 mg via INTRAVENOUS
  Filled 2024-01-18: qty 1
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
## 2024-01-18 NOTE — Discharge Instructions (Signed)
 You were seen for your headache in the emergency department.  You were given medicines which improved your symptoms.  At home, please take Tylenol  and ibuprofen for your headache.  You may also take the Reglan  we have prescribed you for your headache or any nausea or vomiting that you have.  You may take this with Benadryl  for severe headaches but please note that this will make you drowsy.    Check your MyChart online for the results of any tests that had not resulted by the time you left the emergency department.   Follow-up with your neurologist regarding your visit.    Return immediately to the emergency department if you experience any of the following: Vision changes, numbness or weakness of your arms or legs, or any other concerning symptoms.    Thank you for visiting our Emergency Department. It was a pleasure taking care of you today.

## 2024-01-18 NOTE — ED Triage Notes (Signed)
 Pt c/o pressure to the back of his head x3-4 days with dizziness on and off.

## 2024-01-24 ENCOUNTER — Encounter: Payer: Self-pay | Admitting: Neurology

## 2024-01-24 ENCOUNTER — Ambulatory Visit: Admitting: Neurology

## 2024-01-24 VITALS — BP 129/92 | HR 104 | Ht 69.0 in | Wt 180.0 lb

## 2024-01-24 DIAGNOSIS — I10 Essential (primary) hypertension: Secondary | ICD-10-CM

## 2024-01-24 DIAGNOSIS — R000 Tachycardia, unspecified: Secondary | ICD-10-CM

## 2024-01-24 DIAGNOSIS — G479 Sleep disorder, unspecified: Secondary | ICD-10-CM | POA: Diagnosis not present

## 2024-01-24 DIAGNOSIS — R519 Headache, unspecified: Secondary | ICD-10-CM | POA: Diagnosis not present

## 2024-01-24 DIAGNOSIS — R351 Nocturia: Secondary | ICD-10-CM
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
## 2024-01-25 ENCOUNTER — Telehealth: Payer: Self-pay | Admitting: Neurology

## 2024-01-25 NOTE — Telephone Encounter (Signed)
 [facility] Dress: [id number] exp. 01/25/24-02/24/24 sent to GI [phone]

## 2024-01-27 ENCOUNTER — Ambulatory Visit: Payer: Self-pay | Admitting: Neurology

## 2024-01-27 ENCOUNTER — Ambulatory Visit
Admission: RE | Admit: 2024-01-27 | Discharge: 2024-01-27 | Disposition: A | Discharge status: Home / Self Care | Source: Ambulatory Visit | Attending: Neurology | Admitting: Neurology

## 2024-01-27 DIAGNOSIS — G479 Sleep disorder, unspecified: Secondary | ICD-10-CM

## 2024-01-27 DIAGNOSIS — R519 Headache, unspecified: Secondary | ICD-10-CM | POA: Diagnosis not present

## 2024-01-27 DIAGNOSIS — R000 Tachycardia, unspecified: Secondary | ICD-10-CM

## 2024-01-27 DIAGNOSIS — I10 Essential (primary) hypertension: Secondary | ICD-10-CM

## 2024-01-27 DIAGNOSIS — R351 Nocturia: Secondary | ICD-10-CM

## 2024-01-31 NOTE — Telephone Encounter (Signed)
-----   Message from True Mar sent at 01/27/2024  3:59 PM EDT ----- Please call patient and advise him that his recent brain MR angiogram showed no significant hardening of the arteries or narrowing and was reported as unremarkable. ----- Message ----- From: Rosemarie Eather RAMAN, MD Sent: 01/27/2024   3:55 PM EDT To: True Mar, MD
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
## 2024-01-31 NOTE — Telephone Encounter (Signed)
 Spoke to patient gave MR brain test results Per Dr Buck pressure has discussed in last OV pressure could be coming from untreated sleep apnea,elevated BP,or stress Sleep study was ordered 08/2023 per patient thought he didn't need sleep study test however will take now . Will forward to sleep lab and Dr. Buck

## 2024-02-02 ENCOUNTER — Telehealth: Payer: Self-pay | Admitting: Neurology

## 2024-02-02 NOTE — Telephone Encounter (Signed)
 I spoke with the patient.  [employer] [facility] [employer] no auth req via fax form   He is scheduled at [facility] For 02/28/2024 at 8 pm.  Mailed packet to the patient.

## 2024-02-28 ENCOUNTER — Encounter
